# Patient Record
Sex: Female | Born: 1970 | Race: White | Hispanic: No | Marital: Married | Smoking: Current some day smoker
Health system: Southern US, Community
[De-identification: ages and names within clinical notes are randomized; demographics above are authoritative.]

## PROBLEM LIST (undated history)

## (undated) DIAGNOSIS — E559 Vitamin D deficiency, unspecified: Secondary | ICD-10-CM

## (undated) DIAGNOSIS — E785 Hyperlipidemia, unspecified: Secondary | ICD-10-CM

## (undated) DIAGNOSIS — R232 Flushing: Secondary | ICD-10-CM

## (undated) HISTORY — DX: Hyperlipidemia, unspecified: E78.5

## (undated) HISTORY — DX: Flushing: R23.2

## (undated) HISTORY — DX: Vitamin D deficiency, unspecified: E55.9

---

## 1990-08-25 HISTORY — PX: APPENDECTOMY: SHX54

## 2008-08-25 HISTORY — PX: ABDOMINAL HYSTERECTOMY: SHX81

## 2015-08-26 HISTORY — PX: CHOLECYSTECTOMY: SHX55

## 2016-11-20 ENCOUNTER — Encounter: Payer: Self-pay | Admitting: Primary Care

## 2016-11-20 ENCOUNTER — Ambulatory Visit (INDEPENDENT_AMBULATORY_CARE_PROVIDER_SITE_OTHER): Payer: Self-pay | Admitting: Primary Care

## 2016-11-20 VITALS — BP 122/76 | HR 62 | Temp 97.8°F | Ht 63.75 in | Wt 161.4 lb

## 2016-11-20 DIAGNOSIS — Z79890 Hormone replacement therapy (postmenopausal): Secondary | ICD-10-CM | POA: Insufficient documentation

## 2016-11-20 DIAGNOSIS — E8941 Symptomatic postprocedural ovarian failure: Secondary | ICD-10-CM

## 2016-11-20 MED ORDER — ESTRADIOL 1 MG PO TABS: 1.0000 mg | ORAL_TABLET | Freq: Every day | ORAL | 1 refills | Status: DC

## 2016-11-20 NOTE — Progress Notes (Signed)
Pre visit review using our clinic review tool, if applicable. No additional management support is needed unless otherwise documented below in the visit note. 

## 2016-11-20 NOTE — Patient Instructions (Signed)
I sent estradiol 1 mg tablets to your pharmacy. Take 1 tablet by mouth daily. Try to allow for a 7 day rest period after three weeks.  Please call me if you have trouble on the estradiol 1 mg dose.   Please be aware of the potential health risks associated with this medication including breast cancer, dementia, heart disease, blood clots.  It was a pleasure to meet you today! Please don't hesitate to call me with any questions. Welcome to [facility]!

## 2016-11-20 NOTE — Progress Notes (Signed)
   Subjective:    Patient ID: Kelly Banks, female    DOB: 1970/11/08, 46 y.o.   MRN: [id number]  HPI  [name] is a 46 year old female who presents today to establish care and discuss the problems mentioned below. Will obtain old records. She had a complete physical in January 2018 when entering into the [place]. She is from [place].  1) Hormone Replacement Therapy: Currently managed on estradiol 2 mg for which she's taken for the past 4-5 years in [place]. She experiences insomnia, night sweats, hot flashes during the day, headaches without her medication. Hysterectomy in 2010. She's tried several over the counter medications for hot flashes without improvement. She can go a maximum of three days off of her medication before she experiences return of symptoms. She is aware of the potential health risks associated with HRT use.  Review of Systems  Constitutional: Negative for fatigue.  Respiratory: Negative for shortness of breath.   Cardiovascular: Negative for chest pain and leg swelling.  Genitourinary: Negative for vaginal bleeding.       Hot flashes without medication  Skin: Negative for color change.  Neurological: Negative for headaches.       No past medical history on file.   Social History   Social History  . Marital status: Married    Spouse name: N/A  . Number of children: N/A  . Years of education: N/A   Occupational History  . Not on file.   Social History Main Topics  . Smoking status: Current Every Day Smoker  . Smokeless tobacco: Never Used  . Alcohol use No  . Drug use: Unknown  . Sexual activity: Not on file   Other Topics Concern  . Not on file   Social History Narrative   Married.   Moved from [place].   Enjoys painting.    Past Surgical History:  Procedure Laterality Date  . ABDOMINAL HYSTERECTOMY  2010  . APPENDECTOMY  1992  . CHOLECYSTECTOMY  2017    Family History  Problem Relation Age of Onset  . Hypertension Mother   .  Arthritis Mother     Allergies  Allergen Reactions  . Novocain [Procaine]     No current outpatient prescriptions on file prior to visit.   No current facility-administered medications on file prior to visit.     BP 122/76   Pulse 62   Temp 97.8 F (36.6 C) (Oral)   Ht 5' 3.75" (1.619 m)   Wt 161 lb 6.4 oz (73.2 kg)   SpO2 97%   BMI 27.92 kg/m    Objective:   Physical Exam  Constitutional: She appears well-nourished.  Neck: Neck supple.  Cardiovascular: Normal rate and regular rhythm.   Pulmonary/Chest: Effort normal and breath sounds normal.  Skin: Skin is warm and dry.  Psychiatric: She has a normal mood and affect.          Assessment & Plan:

## 2016-11-20 NOTE — Assessment & Plan Note (Signed)
Hysterectomy in 2010. Managed on estradiol 2 mg x 4-5 years. Discussed potential health risks associated with HRT in great detail today. Discussed alternative treatment for hot flashes for which she declined. Will try her at a lower dose of 1 mg to see if this is effective enough. She will update if not.

## 2016-12-08 ENCOUNTER — Other Ambulatory Visit: Payer: Self-pay | Admitting: Primary Care

## 2016-12-08 DIAGNOSIS — N951 Menopausal and female climacteric states: Secondary | ICD-10-CM

## 2016-12-08 MED ORDER — ESTRADIOL 2 MG PO TABS: 2.0000 mg | ORAL_TABLET | Freq: Every day | ORAL | 1 refills | Status: DC

## 2016-12-08 NOTE — Progress Notes (Signed)
Patient's husband sent My Chart message stating that the lower dose of Estradiol was causing return of symptoms. Patient requesting to return to her 2 mg dose. Rx sent to pharmacy and she does understand potential consequences/long term effects of this medication.

## 2017-04-17 ENCOUNTER — Telehealth: Payer: Self-pay

## 2017-04-17 NOTE — Telephone Encounter (Signed)
Pt seeing spots in her rt eye; when first happened like fireworks display but now just small black spot that remains. Pt has had this for 3 days. No pain and no vision problems. Pt will not have ins until Jun 25, 2017. Wants to know if any suggestions without being seen. Will go to UC or ED if worsens over weekend.

## 2017-04-17 NOTE — Telephone Encounter (Signed)
PLEASE NOTE: All timestamps contained within this report are represented as [place] Standard Time. CONFIDENTIALTY NOTICE: This fax transmission is intended only for the addressee. It contains information that is legally privileged, confidential or otherwise protected from use or disclosure. If you are not the intended recipient, you are strictly prohibited from reviewing, disclosing, copying using or disseminating any of this information or taking any action in reliance on or regarding this information. If you have received this fax in error, please notify us immediately by telephone so that we can arrange for its return to [place]. Phone: [phone], Toll-Free: [phone], Fax: [phone] Page: 1 of 1 Call Id: [id number] [facility] - Client Nonclinical Telephone Record [facility] - Client Client Site [facility] - Day Physician Alma Friendly - NP Contact Type Call Who Is Calling Patient / Member / Family / Caregiver Caller Name [place] Phone Number [phone] Reason for Call Symptomatic / Request for Health Information Initial Comment Caller's wife might have scratched her eye, says she sees a black spot in her eye that move left & right. He says her wife is an Development worker, international aid By: Sharlet Salina Transaction Date/Time: 04/17/2017 4:20:12 PM (ET)

## 2017-04-19 NOTE — Telephone Encounter (Signed)
Would recommend she seen an eye doctor as soon as possible if symptoms don't improve. If she feels something in her eye then she can try flushing her eye with normal saline solution.

## 2017-04-20 NOTE — Telephone Encounter (Signed)
Husband took her to [facility] and was evaluated there.

## 2017-06-08 ENCOUNTER — Other Ambulatory Visit: Payer: Self-pay | Admitting: Primary Care

## 2017-06-08 ENCOUNTER — Encounter: Payer: Self-pay | Admitting: Family Medicine

## 2017-06-08 ENCOUNTER — Ambulatory Visit (INDEPENDENT_AMBULATORY_CARE_PROVIDER_SITE_OTHER): Payer: 59 | Admitting: Family Medicine

## 2017-06-08 VITALS — BP 100/60 | HR 49 | Temp 97.7°F | Ht 63.75 in | Wt 163.8 lb

## 2017-06-08 DIAGNOSIS — N951 Menopausal and female climacteric states: Secondary | ICD-10-CM

## 2017-06-08 DIAGNOSIS — M6798 Unspecified disorder of synovium and tendon, other site: Secondary | ICD-10-CM

## 2017-06-08 DIAGNOSIS — G8929 Other chronic pain: Secondary | ICD-10-CM | POA: Diagnosis not present

## 2017-06-08 DIAGNOSIS — M25551 Pain in right hip: Secondary | ICD-10-CM

## 2017-06-08 DIAGNOSIS — M7061 Trochanteric bursitis, right hip: Secondary | ICD-10-CM | POA: Diagnosis not present

## 2017-06-08 DIAGNOSIS — M7062 Trochanteric bursitis, left hip: Secondary | ICD-10-CM | POA: Diagnosis not present

## 2017-06-08 DIAGNOSIS — M67952 Unspecified disorder of synovium and tendon, left thigh: Secondary | ICD-10-CM | POA: Diagnosis not present

## 2017-06-08 DIAGNOSIS — M67951 Unspecified disorder of synovium and tendon, right thigh: Secondary | ICD-10-CM

## 2017-06-08 DIAGNOSIS — M25552 Pain in left hip: Secondary | ICD-10-CM

## 2017-06-08 MED ORDER — ESTRADIOL 2 MG PO TABS
2.0000 mg | ORAL_TABLET | Freq: Every day | ORAL | 1 refills | Status: DC
Start: 2017-06-08 — End: 2017-11-26

## 2017-06-08 MED ORDER — METHYLPREDNISOLONE ACETATE 40 MG/ML IJ SUSP
80.0000 mg | Freq: Once | INTRAMUSCULAR | Status: AC
Start: 2017-06-08 — End: 2017-06-08
  Administered 2017-06-08: 80 mg via INTRA_ARTICULAR

## 2017-06-08 MED ORDER — METHYLPREDNISOLONE ACETATE 40 MG/ML IJ SUSP
80.0000 mg | Freq: Once | INTRAMUSCULAR | Status: AC
  Administered 2017-06-08: 80 mg via INTRA_ARTICULAR

## 2017-06-08 NOTE — Progress Notes (Signed)
Dr. Frederico Hamman T. Kyal Arts, MD, [facility] [place], [address] Phone: [phone] Fax: [phone]  06/08/2017  Patient: Kelly Banks, MRN: [id number], DOB: [birth year], 46 y.o.  Primary Physician:  Pleas Koch, NP   Chief Complaint  Patient presents with  . Hip Pain    Bilateral   Subjective:   Kelly Banks is a 46 y.o. 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 Laterally.   There was a bit of a language barrier given that the patient is [place] and primarily speaks [place] and we had no Optometrist. She has been very getting this, but there was at times a bit of a translational effect.  She has been having intermittent pain in the lateral posterior buttocks for some time. She was getting injections of some sort in [place].She shows me this and it is not of a medication that is FDA approved in [place]. There is a combination of multiple vitamins as well as some anti-inflammatory properties. I've never seen anything like this for sale in [place].  B GTB and b glute medius tendinopathy  Past Medical History, Surgical History, Social History, Family History, Problem List, Medications, and Allergies have been reviewed and updated if relevant.  Patient Active Problem List   Diagnosis Date Noted  . Hot flashes due to menopause 11/20/2016    No past medical history on file.  Past Surgical History:  Procedure Laterality Date  . ABDOMINAL HYSTERECTOMY  2010  . APPENDECTOMY  1992  . CHOLECYSTECTOMY  2017    Social History   Social History  . Marital status: Married    Spouse name: N/A  . Number of children: N/A  . Years of education: N/A   Occupational History  . Not on file.   Social History Main Topics  . Smoking status: Former Research scientist (life sciences)  . Smokeless tobacco: Never Used  . Alcohol use No  . Drug use: Unknown  .  Sexual activity: Not on file   Other Topics Concern  . Not on file   Social History Narrative   Married.   Moved from [place].   Enjoys painting.    Family History  Problem Relation Age of Onset  . Hypertension Mother   . Arthritis Mother     Allergies  Allergen Reactions  . Novocain [Procaine]     Medication list reviewed and updated in full in [place].  GEN: No fevers, chills. Nontoxic. Primarily MSK c/o today. MSK: Detailed in the HPI GI: tolerating PO intake without difficulty Neuro: No numbness, parasthesias, or tingling associated. Otherwise the pertinent positives of the ROS are noted above.   Objective:   BP 100/60   Pulse (!) 49   Temp 97.7 F (36.5 C) (Oral)   Ht 5' 3.75" (1.619 m)   Wt 163 lb 12 oz (74.3 kg)   BMI 28.33 kg/m    GEN: WDWN, NAD, Non-toxic, Alert & Oriented x 3 HEENT: Atraumatic, Normocephalic.  Ears and Nose: No external deformity. EXTR: No clubbing/cyanosis/edema NEURO: Normal gait.  PSYCH: Normally interactive. Conversant. Not depressed or anxious appearing.  Calm demeanor.   HIP EXAM: SIDE: b ROM: Abduction, Flexion, Internal and External range of motion: full Pain with terminal IROM and EROM: no GTB: B Pain at B glute medius distal insertion SLR: NEG Knees: No effusion FABER: NT REVERSE FABER: NT, neg Piriformis: NT  at direct palpation Str: flexion: 5/5 abduction: 5/5 adduction: 5/5 Strength testing non-tender   Radiology: No results found.  Assessment and Plan:   Trochanteric bursitis of both hips  Chronic hip pain, bilateral - Plan: methylPREDNISolone acetate (DEPO-MEDROL) injection 80 mg, methylPREDNISolone acetate (DEPO-MEDROL) injection 80 mg  Tendinopathy of left gluteus medius  Tendinopathy of right gluteus medius  Bilateral trochanteric bursitis with gluteus medius tendinopathy. Her strength is excellent. Continue with core rehabilitation. Hopefully this will be up and back her symptoms and get  her some relief.  Trochanteric Bursitis Injection, R Verbal consent obtained. Risks (including infection, potential atrophy), benefits, and alternatives reviewed. Greater trochanter sterilely prepped with Chloraprep. Ethyl Chloride used for anesthesia. 8 cc of Lidocaine 1% injected with 2 mL of Depo-Medrol 40 mg. 1/2 injeceted to trochanteric bursa at area of maximal tenderness at greater trochanter and 1/2 injected along distal gluteus medius insertion. Needle taken to bone to troch bursa, flows easily. Bursa massaged. No bleeding and no complications. Decreased pain after injection. Needle: 22 gauge spinal needle   Trochanteric Bursitis Injection, L Verbal consent obtained. Risks (including infection, potential atrophy), benefits, and alternatives reviewed. Greater trochanter sterilely prepped with Chloraprep. Ethyl Chloride used for anesthesia. 8 cc of Lidocaine 1% injected with 2 mL of Depo-Medrol 40 mg. 1/2 injected into trochanteric bursa at area of maximal tenderness at greater trochanter and 1/2 injected into gluteus medius insertion. Needle taken to bone to troch bursa, flows easily. Bursa massaged. No bleeding and no complications. Decreased pain after injection. Needle: 22 gauge spinal needle   Follow-up: prn only  Meds ordered this encounter  Medications  . Glucosamine HCl (GLUCOSAMINE PO)    Sig: Take 2 tablets by mouth daily.  . methylPREDNISolone acetate (DEPO-MEDROL) injection 80 mg  . methylPREDNISolone acetate (DEPO-MEDROL) injection 80 mg   Signed,  Zymir Napoli T. Shellia Hartl, MD   Allergies as of 06/08/2017      Reactions   Novocain [procaine]       Medication List       Accurate as of 06/08/17  1:42 PM. Always use your most recent med list.          estradiol 2 MG tablet Commonly known as:  ESTRACE Take 1 tablet (2 mg total) by mouth daily.   GLUCOSAMINE PO Take 2 tablets by mouth daily.

## 2017-06-10 ENCOUNTER — Other Ambulatory Visit: Payer: Self-pay | Admitting: Primary Care

## 2017-06-10 DIAGNOSIS — N951 Menopausal and female climacteric states: Secondary | ICD-10-CM

## 2017-11-09 ENCOUNTER — Other Ambulatory Visit: Payer: Self-pay | Admitting: Primary Care

## 2017-11-09 DIAGNOSIS — E8941 Symptomatic postprocedural ovarian failure: Secondary | ICD-10-CM

## 2017-11-26 ENCOUNTER — Ambulatory Visit (INDEPENDENT_AMBULATORY_CARE_PROVIDER_SITE_OTHER): Payer: 59 | Admitting: Primary Care

## 2017-11-26 VITALS — BP 96/60 | HR 56 | Temp 97.7°F | Ht 63.75 in | Wt 169.2 lb

## 2017-11-26 DIAGNOSIS — Z1239 Encounter for other screening for malignant neoplasm of breast: Secondary | ICD-10-CM

## 2017-11-26 DIAGNOSIS — M79604 Pain in right leg: Secondary | ICD-10-CM

## 2017-11-26 DIAGNOSIS — Z79890 Hormone replacement therapy (postmenopausal): Secondary | ICD-10-CM | POA: Diagnosis not present

## 2017-11-26 DIAGNOSIS — Z1231 Encounter for screening mammogram for malignant neoplasm of breast: Secondary | ICD-10-CM

## 2017-11-26 DIAGNOSIS — M79605 Pain in left leg: Secondary | ICD-10-CM

## 2017-11-26 DIAGNOSIS — N951 Menopausal and female climacteric states: Secondary | ICD-10-CM

## 2017-11-26 LAB — LIPID PANEL
CHOL/HDL RATIO: 5
CHOLESTEROL: 259 mg/dL — AB (ref 0–200)
HDL: 54.7 mg/dL (ref >39.00)
LDL Cholesterol: 168 mg/dL — ABNORMAL HIGH (ref 0–99)
NonHDL: 204.37
TRIGLYCERIDES: 182 mg/dL — AB (ref 0.0–149.0)
VLDL: 36.4 mg/dL (ref 0.0–40.0)

## 2017-11-26 LAB — COMPREHENSIVE METABOLIC PANEL
ALBUMIN: 4 g/dL (ref 3.5–5.2)
ALK PHOS: 50 U/L (ref 39–117)
ALT: 26 U/L (ref 0–35)
AST: 20 U/L (ref 0–37)
BUN: 17 mg/dL (ref 6–23)
CHLORIDE: 104 meq/L (ref 96–112)
CO2: 28 mEq/L (ref 19–32)
CREATININE: 0.91 mg/dL (ref 0.40–1.20)
Calcium: 10.5 mg/dL (ref 8.4–10.5)
GFR: 69.81 mL/min (ref >60.00)
GLUCOSE: 69 mg/dL — AB (ref 70–99)
POTASSIUM: 4.6 meq/L (ref 3.5–5.1)
SODIUM: 139 meq/L (ref 135–145)
TOTAL PROTEIN: 7.4 g/dL (ref 6.0–8.3)
Total Bilirubin: 0.4 mg/dL (ref 0.2–1.2)

## 2017-11-26 LAB — CBC
HCT: 40.4 % (ref 36.0–46.0)
HEMOGLOBIN: 13.7 g/dL (ref 12.0–15.0)
MCHC: 33.9 g/dL (ref 30.0–36.0)
MCV: 89.6 fl (ref 78.0–100.0)
Platelets: 299 10*3/uL (ref 150.0–400.0)
RBC: 4.51 Mil/uL (ref 3.87–5.11)
RDW: 13.7 % (ref 11.5–15.5)
WBC: 5.9 10*3/uL (ref 4.0–10.5)

## 2017-11-26 LAB — TSH: TSH: 1.24 u[IU]/mL (ref 0.35–4.50)

## 2017-11-26 LAB — VITAMIN B12: VITAMIN B 12: 570 pg/mL (ref 211–911)

## 2017-11-26 LAB — VITAMIN D 25 HYDROXY (VIT D DEFICIENCY, FRACTURES): VITD: 15.85 ng/mL — AB (ref 30.00–100.00)

## 2017-11-26 MED ORDER — ESTRADIOL 2 MG PO TABS
2.0000 mg | ORAL_TABLET | Freq: Every day | ORAL | 1 refills | Status: DC
Start: 2017-11-26 — End: 2018-06-09

## 2017-11-26 NOTE — Assessment & Plan Note (Signed)
Managed on estradiol 2 mg for which she's taken for years. Check lipids, TSH, CMP today. Order for screening mammogram placed today. Refills sent to pharmacy.

## 2017-11-26 NOTE — Progress Notes (Signed)
Subjective:    Patient ID: Kelly Banks, female    DOB: 1971-03-21, 47 y.o.   MRN: [id number]  HPI  Ms. Kelly Banks is a 47 year old female who presents today for medication refill and a chief complaint of lower extremity pain.   She is currently managed on estradiol 2 mg tablets for which she's taken for years. We attempted to wean down on her dose last year for which she could not tolerate. She is needing refills today.   She also reports bilateral lower extremity cramping/achy feelings that have been present intermittently for the past 2 years. Over the past 2 weeks her legs have been more consistently bothering her. She's been taking OTC magnesium and potassium and applied Voltaren Gel with some improvement. Her symptoms are not present during ambulation or during the day, only at night when resting. She denies calf swelling, erythema, extreme exercise, long travel.   Review of Systems  Respiratory: Negative for shortness of breath.   Cardiovascular: Negative for chest pain.  Musculoskeletal: Positive for myalgias.       Bilateral lower extremity pain/cramping  Neurological: Negative for dizziness and headaches.       No past medical history on file.   Social History   Socioeconomic History  . Marital status: Married    Spouse name: Not on file  . Number of children: Not on file  . Years of education: Not on file  . Highest education level: Not on file  Occupational History  . Not on file  Social Needs  . Financial resource strain: Not on file  . Food insecurity:    Worry: Not on file    Inability: Not on file  . Transportation needs:    Medical: Not on file    Non-medical: Not on file  Tobacco Use  . Smoking status: Former Research scientist (life sciences)  . Smokeless tobacco: Never Used  Substance and Sexual Activity  . Alcohol use: No  . Drug use: Not on file  . Sexual activity: Not on file  Lifestyle  . Physical activity:    Days per week: Not on file    Minutes per session: Not on  file  . Stress: Not on file  Relationships  . Social connections:    Talks on phone: Not on file    Gets together: Not on file    Attends religious service: Not on file    Active member of club or organization: Not on file    Attends meetings of clubs or organizations: Not on file    Relationship status: Not on file  . Intimate partner violence:    Fear of current or ex partner: Not on file    Emotionally abused: Not on file    Physically abused: Not on file    Forced sexual activity: Not on file  Other Topics Concern  . Not on file  Social History Narrative   Married.   Moved from [place].   Enjoys painting.     Family History  Problem Relation Age of Onset  . Hypertension Mother   . Arthritis Mother     Allergies  Allergen Reactions  . Procaine Palpitations    Current Outpatient Medications on File Prior to Visit  Medication Sig Dispense Refill  . Glucosamine HCl (GLUCOSAMINE PO) Take 2 tablets by mouth daily.     No current facility-administered medications on file prior to visit.     BP 96/60   Pulse (!) 56   Temp 97.7 F (  36.5 C) (Oral)   Ht 5' 3.75" (1.619 m)   Wt 169 lb 4 oz (76.8 kg)   SpO2 98%   BMI 29.28 kg/m    Objective:   Physical Exam  Constitutional: She appears well-nourished.  Neck: Neck supple.  Cardiovascular: Normal rate and regular rhythm.  Pulses:      Dorsalis pedis pulses are 2+ on the right side, and 2+ on the left side.       Posterior tibial pulses are 2+ on the right side, and 2+ on the left side.  No calf swelling, no lower extremity edema  Pulmonary/Chest: Effort normal and breath sounds normal.  Skin: Skin is warm and dry. No erythema.  Psychiatric: She has a normal mood and affect.          Assessment & Plan:

## 2017-11-26 NOTE — Patient Instructions (Signed)
Stop by the lab prior to leaving today. I will notify you of your results once received.   Call the [facility] to schedule your mammogram.   Start stretching your legs before bedtime and every morning.  I sent refills for your estradiol to your pharmacy.  Have a great time in [place]! It was a pleasure to see you today!

## 2017-11-26 NOTE — Assessment & Plan Note (Signed)
Intermittent for years, more recurrent over last 2 weeks. No suspicion for DVT on exam or during HPI. No signs of claudication, pedal pulses 2+ bilaterally. Will have her start stretching before bed and every morning. Check labs today including vitamin D, B12, CBC.

## 2017-12-01 ENCOUNTER — Encounter: Payer: Self-pay | Admitting: *Deleted

## 2018-06-09 ENCOUNTER — Other Ambulatory Visit: Payer: Self-pay | Admitting: Primary Care

## 2018-06-09 DIAGNOSIS — N951 Menopausal and female climacteric states: Secondary | ICD-10-CM

## 2018-11-01 ENCOUNTER — Other Ambulatory Visit: Payer: Self-pay | Admitting: Primary Care

## 2018-11-01 DIAGNOSIS — N951 Menopausal and female climacteric states: Secondary | ICD-10-CM

## 2018-11-09 ENCOUNTER — Other Ambulatory Visit: Payer: Self-pay | Admitting: Primary Care

## 2018-11-09 DIAGNOSIS — N951 Menopausal and female climacteric states: Secondary | ICD-10-CM

## 2018-11-11 ENCOUNTER — Other Ambulatory Visit: Payer: Self-pay

## 2018-11-11 ENCOUNTER — Encounter: Payer: Self-pay | Admitting: Primary Care

## 2018-11-11 ENCOUNTER — Ambulatory Visit (INDEPENDENT_AMBULATORY_CARE_PROVIDER_SITE_OTHER): Payer: 59 | Admitting: Primary Care

## 2018-11-11 VITALS — BP 100/62 | HR 60 | Temp 97.8°F | Ht 63.75 in | Wt 161.5 lb

## 2018-11-11 DIAGNOSIS — E785 Hyperlipidemia, unspecified: Secondary | ICD-10-CM | POA: Insufficient documentation

## 2018-11-11 DIAGNOSIS — Z1239 Encounter for other screening for malignant neoplasm of breast: Secondary | ICD-10-CM | POA: Diagnosis not present

## 2018-11-11 DIAGNOSIS — E559 Vitamin D deficiency, unspecified: Secondary | ICD-10-CM | POA: Insufficient documentation

## 2018-11-11 DIAGNOSIS — N951 Menopausal and female climacteric states: Secondary | ICD-10-CM | POA: Diagnosis not present

## 2018-11-11 DIAGNOSIS — Z79890 Hormone replacement therapy (postmenopausal): Secondary | ICD-10-CM

## 2018-11-11 DIAGNOSIS — R21 Rash and other nonspecific skin eruption: Secondary | ICD-10-CM | POA: Diagnosis not present

## 2018-11-11 MED ORDER — METHYLPREDNISOLONE ACETATE 80 MG/ML IJ SUSP
80.0000 mg | Freq: Once | INTRAMUSCULAR | Status: AC
  Administered 2018-11-11: 80 mg via INTRAMUSCULAR

## 2018-11-11 MED ORDER — ESTRADIOL 2 MG PO TABS
2.0000 mg | ORAL_TABLET | Freq: Every day | ORAL | 0 refills | Status: DC
Start: 2018-11-11 — End: 2019-02-02

## 2018-11-11 NOTE — Progress Notes (Signed)
Subjective:    Patient ID: Kelly Banks, female    DOB: 1971/08/18, 48 y.o.   MRN: [id number]  HPI  Kelly Banks is a 48 year old female who presents today with a chief complaint of rash and medication refill. She is also due for follow up of hyperlipidemia.   1) Rash: Her rash is located to the bilateral cheeks with redness, swelling, and itchy sensation. Her symptoms began four days ago. She denies changes in soaps, detergents, medications. She has been in her garden working with flowers and soil, was wearing gardening gloves. She's taken Claritin, hydrocortisone, tea tree oil without improvement. She denies shortness of breath, wheezing, throat tightness.   2) Hyperlipidemia: Lipid panel last year with LDL of 168 with TC of 259 in April 2019. She is doing some exercise, endorses a healthy diet. She has eaten within the last one hour.   3) Hot Flashes: She is requesting a refill of her estradiol for which she takes once daily. No recent mammogram on file as she never followed through last year. She is agreeable to a mammogram now. We attempted to wean down on her estradiol dose before and she was unable to tolerate.   Review of Systems  Constitutional: Negative for fever.  Respiratory: Negative for shortness of breath.   Cardiovascular: Negative for chest pain.  Skin: Positive for rash.       No past medical history on file.   Social History   Socioeconomic History  . Marital status: Married    Spouse name: Not on file  . Number of children: Not on file  . Years of education: Not on file  . Highest education level: Not on file  Occupational History  . Not on file  Social Needs  . Financial resource strain: Not on file  . Food insecurity:    Worry: Not on file    Inability: Not on file  . Transportation needs:    Medical: Not on file    Non-medical: Not on file  Tobacco Use  . Smoking status: Former Research scientist (life sciences)  . Smokeless tobacco: Never Used  Substance and Sexual Activity   . Alcohol use: No  . Drug use: Not on file  . Sexual activity: Not on file  Lifestyle  . Physical activity:    Days per week: Not on file    Minutes per session: Not on file  . Stress: Not on file  Relationships  . Social connections:    Talks on phone: Not on file    Gets together: Not on file    Attends religious service: Not on file    Active member of club or organization: Not on file    Attends meetings of clubs or organizations: Not on file    Relationship status: Not on file  . Intimate partner violence:    Fear of current or ex partner: Not on file    Emotionally abused: Not on file    Physically abused: Not on file    Forced sexual activity: Not on file  Other Topics Concern  . Not on file  Social History Narrative   Married.   Moved from [place].   Enjoys painting.     Family History  Problem Relation Age of Onset  . Hypertension Mother   . Arthritis Mother     Allergies  Allergen Reactions  . Procaine Palpitations    Current Outpatient Medications on File Prior to Visit  Medication Sig Dispense Refill  . estradiol (  ESTRACE) 2 MG tablet TAKE 1 TABLET BY MOUTH ONCE DAILY 90 tablet 0  . Glucosamine HCl (GLUCOSAMINE PO) Take 2 tablets by mouth daily.     No current facility-administered medications on file prior to visit.     BP 100/62   Pulse 60   Temp 97.8 F (36.6 C) (Oral)   Ht 5' 3.75" (1.619 m)   Wt 161 lb 8 oz (73.3 kg)   SpO2 97%   BMI 27.94 kg/m    Objective:   Physical Exam  Constitutional: She appears well-nourished.  Neck: Neck supple.  Cardiovascular: Normal rate and regular rhythm.  Respiratory: Effort normal and breath sounds normal.  Skin: Skin is warm and dry.  Erythema without cellulitis to bilateral cheeks, mild swelling to left cheek. No bumps, wounds.  Psychiatric: She has a normal mood and affect.           Assessment & Plan:

## 2018-11-11 NOTE — Assessment & Plan Note (Signed)
Repeat vitamin D pending. Discussed to start 2000 units daily.

## 2018-11-11 NOTE — Addendum Note (Signed)
Addended by: Jacqualin Combes on: 11/11/2018 01:12 PM   Modules accepted: Orders

## 2018-11-11 NOTE — Assessment & Plan Note (Signed)
Never completed mammogram last year, strongly advise she complete this year. She agrees so orders were placed. We also discussed risk factors for long term estrogen replacement including CAD, breast cancer, etc and she verbalized understanding. She has hyperlipidemia and has declined treatment in the past.   Repeat lipids pending. Mammogram ordered. Refills provided.

## 2018-11-11 NOTE — Assessment & Plan Note (Signed)
Appears to be contact dermatitis. Also could be some underlying sunburn. No evidence of shingles. IM Depo Medrol 80 mg provided today as she refuses oral prednisone. She will update if symptoms do not improve. Discussed to use sunscreen when outdoors.

## 2018-11-11 NOTE — Patient Instructions (Addendum)
You must complete a mammogram in order to continue your estrogen medication. Please call the [facility] to schedule your mammogram.   Start taking vitamin D 2000 units daily.   Start exercising. You should be getting 150 minutes of moderate intensity exercise weekly.  It's important to improve your diet by reducing consumption of fast food, fried food, processed snack foods, sugary drinks. Increase consumption of fresh vegetables and fruits, whole grains, water.  Ensure you are drinking 64 ounces of water daily.  Schedule a lab only appointment to return when you've not eaten for four hours.   Please update me if no resolve in your rash in 3-4 days. Continue loratadine. Wear sunscreen when outdoors.

## 2018-11-11 NOTE — Assessment & Plan Note (Signed)
Noted on labs from last year.  Discussed

## 2018-12-22 ENCOUNTER — Telehealth: Payer: Self-pay | Admitting: *Deleted

## 2018-12-22 NOTE — Telephone Encounter (Signed)
Spoken and notified patient's husband of Tawni Millers comments. Patient's husband verbalized understanding.

## 2018-12-22 NOTE — Telephone Encounter (Signed)
Patient's husband called stating that patient was told that her estrogen can not be refilled until she has a mammogram. Kelly Banks stated that she was scheduled for a mammogram on 12/24/18 and they got a call that this test is being rescheduled for 03/01/19. Patient's husband stated that his wife can not do without this medication because of her hot flashes. Patient's husband wants to know if medication can be refilled until her upcoming appointment on 03/01/19?

## 2018-12-22 NOTE — Telephone Encounter (Signed)
Yes, that will be fine. She was provided with a three month supply in March 2020, so this will last her until June 2020. We will refill at that time.

## 2019-01-18 ENCOUNTER — Other Ambulatory Visit: Payer: Self-pay

## 2019-01-18 ENCOUNTER — Telehealth: Payer: Self-pay

## 2019-01-18 ENCOUNTER — Encounter: Payer: Self-pay | Admitting: Primary Care

## 2019-01-18 ENCOUNTER — Ambulatory Visit (INDEPENDENT_AMBULATORY_CARE_PROVIDER_SITE_OTHER): Payer: 59 | Admitting: Primary Care

## 2019-01-18 VITALS — BP 104/64 | HR 65 | Temp 97.8°F | Ht 63.75 in | Wt 160.8 lb

## 2019-01-18 DIAGNOSIS — K121 Other forms of stomatitis: Secondary | ICD-10-CM

## 2019-01-18 DIAGNOSIS — E785 Hyperlipidemia, unspecified: Secondary | ICD-10-CM | POA: Diagnosis not present

## 2019-01-18 DIAGNOSIS — E559 Vitamin D deficiency, unspecified: Secondary | ICD-10-CM

## 2019-01-18 HISTORY — DX: Other forms of stomatitis: K12.1

## 2019-01-18 LAB — COMPREHENSIVE METABOLIC PANEL
ALT: 19 U/L (ref 0–35)
AST: 20 U/L (ref 0–37)
Albumin: 4.2 g/dL (ref 3.5–5.2)
Alkaline Phosphatase: 57 U/L (ref 39–117)
BUN: 12 mg/dL (ref 6–23)
CO2: 29 mEq/L (ref 19–32)
Calcium: 10.1 mg/dL (ref 8.4–10.5)
Chloride: 102 mEq/L (ref 96–112)
Creatinine, Ser: 0.98 mg/dL (ref 0.40–1.20)
GFR: 60.02 mL/min (ref >60.00)
Glucose, Bld: 92 mg/dL (ref 70–99)
Potassium: 5 mEq/L (ref 3.5–5.1)
Sodium: 137 mEq/L (ref 135–145)
Total Bilirubin: 0.4 mg/dL (ref 0.2–1.2)
Total Protein: 7.7 g/dL (ref 6.0–8.3)

## 2019-01-18 LAB — LIPID PANEL
Cholesterol: 285 mg/dL — ABNORMAL HIGH (ref 0–200)
HDL: 55.2 mg/dL (ref >39.00)
LDL Cholesterol: 209 mg/dL — ABNORMAL HIGH (ref 0–99)
NonHDL: 229.88
Total CHOL/HDL Ratio: 5
Triglycerides: 104 mg/dL (ref 0.0–149.0)
VLDL: 20.8 mg/dL (ref 0.0–40.0)

## 2019-01-18 LAB — VITAMIN D 25 HYDROXY (VIT D DEFICIENCY, FRACTURES): VITD: 33.94 ng/mL (ref 30.00–100.00)

## 2019-01-18 MED ORDER — TRIAMCINOLONE ACETONIDE 0.1 % MT PSTE: 1.0000 "application " | PASTE | Freq: Two times a day (BID) | OROMUCOSAL | 0 refills | Status: DC

## 2019-01-18 MED ORDER — LIDOCAINE VISCOUS HCL 2 % MT SOLN: 15.0000 mL | Freq: Three times a day (TID) | OROMUCOSAL | 0 refills | Status: DC | PRN

## 2019-01-18 NOTE — Progress Notes (Signed)
Subjective:    Patient ID: Kelly Banks, female    DOB: 04-16-1971, 48 y.o.   MRN: [id number]  HPI  Kelly Banks is a 48 year old female who presents today with a chief complaint of mouth sores.  History of intermittent recurrent boils to groin since childhood. She noticed a toothache under her left upper molar crown about one week ago. She obtained some antibiotics from a local [place] store and has been taking Amoxicillin for the last 6 days with improvement in pain. Two days ago she then she's noticed painful sores to the roof of her mouth. She denies changes in food, drinking allergy to Amoxicillin, shortness of breath, throat tightness, fevers.  Review of Systems  Constitutional: Negative for fever.  HENT: Negative for sore throat.        Oral sores  Respiratory: Negative for cough and shortness of breath.        Past Medical History:  Diagnosis Date  . Hot flashes   . Hyperlipidemia   . Vitamin D deficiency      Social History   Socioeconomic History  . Marital status: Married    Spouse name: Not on file  . Number of children: Not on file  . Years of education: Not on file  . Highest education level: Not on file  Occupational History  . Not on file  Social Needs  . Financial resource strain: Not on file  . Food insecurity:    Worry: Not on file    Inability: Not on file  . Transportation needs:    Medical: Not on file    Non-medical: Not on file  Tobacco Use  . Smoking status: Former Research scientist (life sciences)  . Smokeless tobacco: Never Used  Substance and Sexual Activity  . Alcohol use: No  . Drug use: Not on file  . Sexual activity: Not on file  Lifestyle  . Physical activity:    Days per week: Not on file    Minutes per session: Not on file  . Stress: Not on file  Relationships  . Social connections:    Talks on phone: Not on file    Gets together: Not on file    Attends religious service: Not on file    Active member of club or organization: Not on file   Attends meetings of clubs or organizations: Not on file    Relationship status: Not on file  . Intimate partner violence:    Fear of current or ex partner: Not on file    Emotionally abused: Not on file    Physically abused: Not on file    Forced sexual activity: Not on file  Other Topics Concern  . Not on file  Social History Narrative   Married.   Moved from [place].   Enjoys painting.    Past Surgical History:  Procedure Laterality Date  . ABDOMINAL HYSTERECTOMY  2010  . APPENDECTOMY  1992  . CHOLECYSTECTOMY  2017    Family History  Problem Relation Age of Onset  . Hypertension Mother   . Arthritis Mother     Allergies  Allergen Reactions  . Procaine Palpitations    Current Outpatient Medications on File Prior to Visit  Medication Sig Dispense Refill  . estradiol (ESTRACE) 2 MG tablet Take 1 tablet (2 mg total) by mouth daily. 90 tablet 0  . Glucosamine HCl (GLUCOSAMINE PO) Take 2 tablets by mouth daily.     No current facility-administered medications on file prior to visit.  BP 104/64   Pulse 65   Temp 97.8 F (36.6 C) (Tympanic)   Ht 5' 3.75" (1.619 m)   Wt 160 lb 12 oz (72.9 kg)   SpO2 98%   BMI 27.81 kg/m    Objective:   Physical Exam  Constitutional: She appears well-nourished. She does not have a sickly appearance. She does not appear ill.  HENT:  Several oral ulcers to hard palate of upper oral cavity. No dental irritation or drainage noted.  Neck: Neck supple.  Cardiovascular: Normal rate.  Respiratory: Effort normal.  Skin: Skin is warm and dry.           Assessment & Plan:

## 2019-01-18 NOTE — Telephone Encounter (Signed)
Kelly Banks from [facility] left v/m; triamcinolone dental paste in a small tube is $45.70 cost to pt. Kelly Banks request alternate med such as Dukes Magic mouthwash or whatever Gentry Fitz NP wants to prescribe that will not be as expensive. Kelly Banks request cb.

## 2019-01-18 NOTE — Assessment & Plan Note (Signed)
Noted to oral cavity.  Unclear cause. Doesn't appear to be allergic reaction to medications she's currently taking.   Rx for triamcinolone paste provided to use BID x 1 week. Rx for viscous lidocaine provided to use PRN pain.  She will update if symptoms persist.

## 2019-01-18 NOTE — Telephone Encounter (Signed)
Have the filled the viscous lidocaine? If not then cancel. Can we give a verbal order for Dukes Magic Mouthwash with: 80 ml liquid benadryl 80 ml viscous lidocaine 2% 80 ml maalox  #240. Swish and spit every 8 hours as needed.

## 2019-01-18 NOTE — Telephone Encounter (Signed)
Gave the order for the mouthwash as instructed.

## 2019-01-18 NOTE — Patient Instructions (Signed)
Apply the triamcinolone paste twice daily for one week.  You may use the viscous lidocaine three times daily for mouth pain. Swish and spit.   It was a pleasure to see you today!

## 2019-01-24 ENCOUNTER — Encounter: Payer: Self-pay | Admitting: *Deleted

## 2019-01-31 ENCOUNTER — Telehealth: Payer: Self-pay | Admitting: Primary Care

## 2019-01-31 DIAGNOSIS — E785 Hyperlipidemia, unspecified: Secondary | ICD-10-CM

## 2019-01-31 DIAGNOSIS — R232 Flushing: Secondary | ICD-10-CM

## 2019-01-31 DIAGNOSIS — Z79890 Hormone replacement therapy (postmenopausal): Secondary | ICD-10-CM

## 2019-01-31 MED ORDER — ATORVASTATIN CALCIUM 40 MG PO TABS: 40.0000 mg | ORAL_TABLET | Freq: Every day | ORAL | 0 refills | Status: DC

## 2019-01-31 NOTE — Telephone Encounter (Addendum)
Message left for patient to return my call.  

## 2019-01-31 NOTE — Telephone Encounter (Signed)
Spoken and notified patient of Kelly Banks comments. Patient verbalized understanding. Lab appt 03/14/2019

## 2019-01-31 NOTE — Telephone Encounter (Signed)
Spoken to patient's husband and patient. We corrected the phone number in patient's chart.  Also patient agreeable to the cholesterol medication. Patient's husband mention Lipitor since that is what he taking. Please send to [place].   Also would like a referral to GYN, discuss about estradiol, patient's husband thinks it would be a good idea to talk to specialist regarding continue this.

## 2019-01-31 NOTE — Telephone Encounter (Signed)
Patient's Husband and Patient called in today in regards to the letter they received in the mail. They did update patient's phone number which was incorrect.    They would like a call back to discuss this letter.   PHONE- [phone]

## 2019-01-31 NOTE — Telephone Encounter (Signed)
Noted. Rx for Lipitor sent to pharmacy. Needs repeat labs in 6 weeks, please schedule. Agree that GYN may need to get involved, referral placed.

## 2019-02-01 ENCOUNTER — Other Ambulatory Visit: Payer: Self-pay | Admitting: Primary Care

## 2019-02-01 DIAGNOSIS — N951 Menopausal and female climacteric states: Secondary | ICD-10-CM

## 2019-02-02 NOTE — Telephone Encounter (Signed)
Noted. Will refill until she can be seen by GYN.

## 2019-02-02 NOTE — Telephone Encounter (Signed)
Last prescribed on 11/11/2018. Last appointment on 01/18/2019. No future appointment

## 2019-02-24 ENCOUNTER — Telehealth: Payer: Self-pay

## 2019-02-24 NOTE — Telephone Encounter (Signed)
Coronavirus (COVID-19) Are you at risk?  Are you at risk for the Coronavirus (COVID-19)?  To be considered HIGH RISK for Coronavirus (COVID-19), you have to meet the following criteria:  . Traveled to [place], [place], [place], [place] or [place]; or in the [place] to [place], [place], [place], or [place]; and have fever, cough, and shortness of breath within the last 2 weeks of travel OR . Been in close contact with a person diagnosed with COVID-19 within the last 2 weeks and have fever, cough, and shortness of breath . IF YOU DO NOT MEET THESE CRITERIA, YOU ARE CONSIDERED LOW RISK FOR COVID-19.  What to do if you are HIGH RISK for COVID-19?  . If you are having a medical emergency, call 911. . Seek medical care right away. Before you go to a doctor's office, urgent care or emergency department, call ahead and tell them about your recent travel, contact with someone diagnosed with COVID-19, and your symptoms. You should receive instructions from your physician's office regarding next steps of care.  . When you arrive at healthcare provider, tell the healthcare staff immediately you have returned from visiting [place], [place], [place], [place] or [place]; or traveled in the [place] to [place], [place], [place], or [place]; in the last two weeks or you have been in close contact with a person diagnosed with COVID-19 in the last 2 weeks.   . Tell the health care staff about your symptoms: fever, cough and shortness of breath. . After you have been seen by a medical provider, you will be either: o Tested for (COVID-19) and discharged home on quarantine except to seek medical care if symptoms worsen, and asked to  - Stay home and avoid contact with others until you get your results (4-5 days)  - Avoid travel on public transportation if possible (such as bus, train, or airplane) or o Sent to the Emergency Department by EMS for evaluation, COVID-19 testing, and possible  admission depending on your condition and test results.  What to do if you are LOW RISK for COVID-19?  Reduce your risk of any infection by using the same precautions used for avoiding the common cold or flu:  . Wash your hands often with soap and warm water for at least 20 seconds.  If soap and water are not readily available, use an alcohol-based hand sanitizer with at least 60% alcohol.  . If coughing or sneezing, cover your mouth and nose by coughing or sneezing into the elbow areas of your shirt or coat, into a tissue or into your sleeve (not your hands). . Avoid shaking hands with others and consider head nods or verbal greetings only. . Avoid touching your eyes, nose, or mouth with unwashed hands.  . Avoid close contact with people who are sick. . Avoid places or events with large numbers of people in one location, like concerts or sporting events. . Carefully consider travel plans you have or are making. . If you are planning any travel outside or inside the [place], visit the CDC's Travelers' Health webpage for the latest health notices. . If you have some symptoms but not all symptoms, continue to monitor at home and seek medical attention if your symptoms worsen. . If you are having a medical emergency, call 911.   ADDITIONAL HEALTHCARE OPTIONS FOR PATIENTS  [facility] / e-Visit: [email or web address]         [facility]: [phone]  [facility]: [phone]                   [facility]: [phone]   Pre-screen negative, DM.   

## 2019-02-28 ENCOUNTER — Encounter: Payer: Self-pay | Admitting: Certified Nurse Midwife

## 2019-02-28 ENCOUNTER — Ambulatory Visit (INDEPENDENT_AMBULATORY_CARE_PROVIDER_SITE_OTHER): Payer: 59 | Admitting: Certified Nurse Midwife

## 2019-02-28 ENCOUNTER — Other Ambulatory Visit: Payer: Self-pay

## 2019-02-28 VITALS — BP 94/58 | HR 55 | Ht 63.0 in | Wt 161.6 lb

## 2019-02-28 DIAGNOSIS — E8941 Symptomatic postprocedural ovarian failure: Secondary | ICD-10-CM

## 2019-02-28 DIAGNOSIS — N393 Stress incontinence (female) (male): Secondary | ICD-10-CM | POA: Diagnosis not present

## 2019-02-28 NOTE — Progress Notes (Signed)
GYN ENCOUNTER NOTE  Subjective:       Kelly Banks is a 48 y.o. [phone] female here to discuss Estrace use while taking Lipitor.   Started Lipitor approximately six (6) weeks ago.   Has been taking one (1) to two (2)  mg PO Estrace for the last 10 years. Taking one (1) mg PO daily for the last two (2) weeks with good relief of symptoms-hot flashes and insomnia.   Reports occasional leakage of urine with sneezing and coughing.   Denies difficulty breathing or respiratory distress, chest pain, abdominal pain, vaginal bleeding, dysuria, and leg pain or swelling.   History of 20 year smoker, stopped two (2) years ago.    Gynecologic History  No LMP recorded. Patient has had a hysterectomy.  Contraception: status post hysterectomy  Last Pap: N/A  Last mammogram: 2017. Results were: normal per patient.   Obstetric History OB History  Gravida Para Term Preterm AB Living  4 2 2  0 2 1  SAB TAB Ectopic Multiple Live Births  0 2 0 0 2    # Outcome Date GA Lbr Len/2nd Weight Sex Delivery Anes PTL Lv  4 TAB 2008          3 Term 08/12/94   7 lb 0.9 oz (3.2 kg) M Vag-Spont None N LIV  2 Term 01/15/93   5 lb 15.2 oz (2.7 kg) M Vag-Spont None N DEC  1 TAB 1988            Past Medical History:  Diagnosis Date  . Hot flashes   . Hyperlipidemia   . Vitamin D deficiency     Past Surgical History:  Procedure Laterality Date  . ABDOMINAL HYSTERECTOMY  2010  . APPENDECTOMY  1992  . CHOLECYSTECTOMY  2017    Current Outpatient Medications on File Prior to Visit  Medication Sig Dispense Refill  . atorvastatin (LIPITOR) 40 MG tablet Take 1 tablet (40 mg total) by mouth daily. For cholesterol. 90 tablet 0  . estradiol (ESTRACE) 2 MG tablet TAKE 1 TABLET BY MOUTH ONCE A DAY (Patient taking differently: Take 1 mg by mouth daily. ) 90 tablet 0  . Glucosamine HCl (GLUCOSAMINE PO) Take 2 tablets by mouth daily.    . Multiple Vitamins-Minerals (MULTIVITAMIN WITH MINERALS) tablet Take 1  tablet by mouth daily.     No current facility-administered medications on file prior to visit.     Allergies  Allergen Reactions  . Procaine Palpitations    Social History   Socioeconomic History  . Marital status: Married    Spouse name: Not on file  . Number of children: Not on file  . Years of education: Not on file  . Highest education level: Not on file  Occupational History  . Not on file  Social Needs  . Financial resource strain: Not on file  . Food insecurity    Worry: Not on file    Inability: Not on file  . Transportation needs    Medical: Not on file    Non-medical: Not on file  Tobacco Use  . Smoking status: Former Research scientist (life sciences)  . Smokeless tobacco: Never Used  Substance and Sexual Activity  . Alcohol use: No  . Drug use: Never  . Sexual activity: Yes    Birth control/protection: Surgical  Lifestyle  . Physical activity    Days per week: Not on file    Minutes per session: Not on file  . Stress: Not on file  Relationships  .  Social Herbalist on phone: Not on file    Gets together: Not on file    Attends religious service: Not on file    Active member of club or organization: Not on file    Attends meetings of clubs or organizations: Not on file    Relationship status: Not on file  . Intimate partner violence    Fear of current or ex partner: Not on file    Emotionally abused: Not on file    Physically abused: Not on file    Forced sexual activity: Not on file  Other Topics Concern  . Not on file  Social History Narrative   Married.   Moved from [place].   Enjoys painting.    Family History  Problem Relation Age of Onset  . Hypertension Mother   . Arthritis Mother   . Breast cancer Neg Hx   . Ovarian cancer Neg Hx   . Colon cancer Neg Hx     The following portions of the patient's history were reviewed and updated as appropriate: allergies, current medications, past family history, past medical history, past social history, past  surgical history and problem list.  Review of Systems  Review of Systems - Negative except as noted above.  Information obtained from patient.   Objective:   BP (!) 94/58   Pulse (!) 55   Ht 5\' 3"  (1.6 m)   Wt 161 lb 9.6 oz (73.3 kg)   BMI 28.63 kg/m   CONSTITUTIONAL: Well-developed, well-nourished female in no acute distress.   HENT:  Normocephalic, atraumatic.   NECK: Normal range of motion, supple, no masses.  Normal thyroid.   SKIN: Skin is warm and dry. No rash noted. Not diaphoretic. No erythema. No pallor.  [place]: Alert and oriented to person, place, and time.   PSYCHIATRIC: Normal mood and affect. Normal behavior. Normal judgment and thought content.  PELVIC EXAM: Deferred, no complaints.   MUSCULOSKELETAL: Normal range of motion. No tenderness.  No cyanosis, clubbing, or edema. No signs of DVT on exam.   Assessment:   1. Hot flashes due to surgical menopause   2. Stress incontinence   Plan:   Advised she may continue Estrace as long as she doesn't smoke and continues mammograms regularly. Next mammogram schedule tomorrow per patient.   Suggested lung scan due to history of smoking, will discuss with PCP.   Reviewed red flag symptoms and when to call.   RTC as needed.    Diona Fanti, CNM [facility], [facility] 02/28/19 3:52 PM

## 2019-02-28 NOTE — Progress Notes (Signed)
Patient here to discuss Estrace use while on Lipitor.

## 2019-02-28 NOTE — Patient Instructions (Addendum)
Urinary Incontinence  Urinary incontinence refers to a condition in which a person is unable to control where and when to pass urine. A person with this condition will urinate when he or she does not mean to (involuntarily). What are the causes? This condition may be caused by:  Medicines.  Infections.  Constipation.  Overactive bladder muscles.  Weak bladder muscles.  Weak pelvic floor muscles. These muscles provide support for the bladder, intestine, and, in women, the uterus.  Enlarged prostate in men. The prostate is a gland near the bladder. When it gets too big, it can pinch the urethra. With the urethra blocked, the bladder can weaken and lose the ability to empty properly.  Surgery.  Emotional factors, such as anxiety, stress, or post-traumatic stress disorder (PTSD).  Pelvic organ prolapse. This happens in women when organs shift out of place and into the vagina. This shift can prevent the bladder and urethra from working properly. What increases the risk? The following factors may make you more likely to develop this condition:  Older age.  Obesity and physical inactivity.  Pregnancy and childbirth.  Menopause.  Diseases that affect the nerves or spinal cord (neurological diseases).  Long-term (chronic) coughing. This can increase pressure on the bladder and pelvic floor muscles. What are the signs or symptoms? Symptoms may vary depending on the type of urinary incontinence you have. They include:  A sudden urge to urinate, but passing urine involuntarily before you can get to a bathroom (urge incontinence).  Suddenly passing urine with any activity that forces urine to pass, such as coughing, laughing, exercise, or sneezing (stress incontinence).  Needing to urinate often, but urinating only a small amount, or constantly dribbling urine (overflow incontinence).  Urinating because you cannot get to the bathroom in time due to a physical disability, such as  arthritis or injury, or communication and thinking problems, such as Alzheimer disease (functional incontinence). How is this diagnosed? This condition may be diagnosed based on:  Your medical history.  A physical exam.  Tests, such as: ? Urine tests. ? X-rays of your kidney and bladder. ? Ultrasound. ? CT scan. ? Cystoscopy. In this procedure, a health care provider inserts a tube with a light and camera (cystoscope) through the urethra and into the bladder in order to check for problems. ? Urodynamic testing. These tests assess how well the bladder, urethra, and sphincter can store and release urine. There are different types of urodynamic tests, and they vary depending on what the test is measuring. To help diagnose your condition, your health care provider may recommend that you keep a log of when you urinate and how much you urinate. How is this treated? Treatment for this condition depends on the type of incontinence that you have and its cause. Treatment may include:  Lifestyle changes, such as: ? Quitting smoking. ? Maintaining a healthy weight. ? Staying active. Try to get 150 minutes of moderate-intensity exercise every week. Ask your health care provider which activities are safe for you. ? Eating a healthy diet.  Avoid high-fat foods, like fried foods.  Avoid refined carbohydrates like white bread and white rice.  Limit how much alcohol and caffeine you drink.  Increase your fiber intake. Foods such as fresh fruits, vegetables, beans, and whole grains are healthy sources of fiber.  Pelvic floor muscle exercises.  Bladder training, such as lengthening the amount of time between bathroom breaks, or using the bathroom at regular intervals.  Using techniques to suppress bladder urges.   This can include distraction techniques or controlled breathing exercises.  Medicines to relax the bladder muscles and prevent bladder spasms.  Medicines to help slow or prevent the  growth of a man's prostate.  Botox injections. These can help relax the bladder muscles.  Using pulses of electricity to help change bladder reflexes (electrical nerve stimulation).  For women, using a medical device to prevent urine leaks. This is a small, tampon-like, disposable device that is inserted into the urethra.  Injecting collagen or carbon beads (bulking agents) into the urinary sphincter. These can help thicken tissue and close the bladder opening.  Surgery. Follow these instructions at home: Lifestyle  Limit alcohol and caffeine. These can fill your bladder quickly and irritate it.  Keep yourself clean to help prevent odors and skin damage. Ask your doctor about special skin creams and cleansers that can protect the skin from urine.  Consider wearing pads or adult diapers. Make sure to change them regularly, and always change them right after experiencing incontinence. General instructions  Take over-the-counter and prescription medicines only as told by your health care provider.  Use the bathroom about every 3-4 hours, even if you do not feel the need to urinate. Try to empty your bladder completely every time. After urinating, wait a minute. Then try to urinate again.  Make sure you are in a relaxed position while urinating.  If your incontinence is caused by nerve problems, keep a log of the medicines you take and the times you go to the bathroom.  Keep all follow-up visits as told by your health care provider. This is important. Contact a health care provider if:  You have pain that gets worse.  Your incontinence gets worse. Get help right away if:  You have a fever or chills.  You are unable to urinate.  You have redness in your groin area or down your legs. Summary  Urinary incontinence refers to a condition in which a person is unable to control where and when to pass urine.  This condition may be caused by medicines, infection, weak bladder  muscles, weak pelvic floor muscles, enlargement of the prostate (in men), or surgery.  The following factors increase your risk for developing this condition: older age, obesity, pregnancy and childbirth, menopause, neurological diseases, and chronic coughing.  There are several types of urinary incontinence. They include urge incontinence, stress incontinence, overflow incontinence, and functional incontinence.  This condition is usually treated first with lifestyle and behavioral changes, such as quitting smoking, eating a healthier diet, and doing regular pelvic floor exercises. Other treatment options include medicines, bulking agents, medical devices, electrical nerve stimulation, or surgery. This information is not intended to replace advice given to you by your health care provider. Make sure you discuss any questions you have with your health care provider. Document Released: 09/18/2004 Document Revised: 08/21/2017 Document Reviewed: 11/20/2016 Elsevier Patient Education  [employer]. Estradiol tablets What is this medicine? ESTRADIOL (es tra DYE ole) is an estrogen. It is mostly used as hormone replacement in menopausal women. It helps to treat hot flashes and prevent osteoporosis. It is also used to treat women with low estrogen levels or those who have had their ovaries removed. This medicine may be used for other purposes; ask your health care provider or pharmacist if you have questions. COMMON BRAND NAME(S): Estrace, Femtrace, Gynodiol What should I tell my health care provider before I take this medicine? They need to know if you have or ever had any of  these conditions:  abnormal vaginal bleeding  blood vessel disease or blood clots  breast, cervical, endometrial, ovarian, liver, or uterine cancer  dementia  diabetes  gallbladder disease  heart disease or recent heart attack  high blood pressure  high cholesterol  high level of calcium in the blood   hysterectomy  kidney disease  liver disease  migraine headaches  protein C deficiency  protein S deficiency  stroke  systemic lupus erythematosus (SLE)  tobacco smoker  an unusual or allergic reaction to estrogens, other hormones, medicines, foods, dyes, or preservatives  pregnant or trying to get pregnant  breast-feeding How should I use this medicine? Take this medicine by mouth. To reduce nausea, this medicine may be taken with food. Follow the directions on the prescription label. Take this medicine at the same time each day and in the order directed on the package. Do not take your medicine more often than directed. Contact your pediatrician regarding the use of this medicine in children. Special care may be needed. A patient package insert for the product will be given with each prescription and refill. Read this sheet carefully each time. The sheet may change frequently. Overdosage: If you think you have taken too much of this medicine contact a poison control center or emergency room at once. NOTE: This medicine is only for you. Do not share this medicine with others. What if I miss a dose? If you miss a dose, take it as soon as you can. If it is almost time for your next dose, take only that dose. Do not take double or extra doses. What may interact with this medicine? Do not take this medicine with any of the following medications:  aromatase inhibitors like aminoglutethimide, anastrozole, exemestane, letrozole, testolactone This medicine may also interact with the following medications:  carbamazepine  certain antibiotics used to treat infections  certain barbiturates or benzodiazepines used for inducing sleep or treating seizures  grapefruit juice  medicines for fungus infections like itraconazole and ketoconazole  raloxifene or tamoxifen  rifabutin, rifampin, or rifapentine  ritonavir  St. John's Wort  warfarin This list may not describe all  possible interactions. Give your health care provider a list of all the medicines, herbs, non-prescription drugs, or dietary supplements you use. Also tell them if you smoke, drink alcohol, or use illegal drugs. Some items may interact with your medicine. What should I watch for while using this medicine? Visit your doctor or health care professional for regular checks on your progress. You will need a regular breast and pelvic exam and Pap smear while on this medicine. You should also discuss the need for regular mammograms with your health care professional, and follow his or her guidelines for these tests. This medicine can make your body retain fluid, making your fingers, hands, or ankles swell. Your blood pressure can go up. Contact your doctor or health care professional if you feel you are retaining fluid. If you have any reason to think you are pregnant, stop taking this medicine right away and contact your doctor or health care professional. Smoking increases the risk of getting a blood clot or having a stroke while you are taking this medicine, especially if you are more than 50 years old. You are strongly advised not to smoke. If you wear contact lenses and notice visual changes, or if the lenses begin to feel uncomfortable, consult your eye doctor or health care professional. This medicine can increase the risk of developing a condition (endometrial hyperplasia) that  may lead to cancer of the lining of the uterus. Taking progestins, another hormone drug, with this medicine lowers the risk of developing this condition. Therefore, if your uterus has not been removed (by a hysterectomy), your doctor may prescribe a progestin for you to take together with your estrogen. You should know, however, that taking estrogens with progestins may have additional health risks. You should discuss the use of estrogens and progestins with your health care professional to determine the benefits and risks for you.  If you are going to have surgery, you may need to stop taking this medicine. Consult your health care professional for advice before you schedule the surgery. What side effects may I notice from receiving this medicine? Side effects that you should report to your doctor or health care professional as soon as possible:  allergic reactions like skin rash, itching or hives, swelling of the face, lips, or tongue  breast tissue changes or discharge  changes in vision  chest pain  confusion, trouble speaking or understanding  dark urine  general ill feeling or flu-like symptoms  light-colored stools  nausea, vomiting  pain, swelling, warmth in the leg  right upper belly pain  severe headaches  shortness of breath  sudden numbness or weakness of the face, arm or leg  trouble walking, dizziness, loss of balance or coordination  unusual vaginal bleeding  yellowing of the eyes or skin Side effects that usually do not require medical attention (report to your doctor or health care professional if they continue or are bothersome):  hair loss  increased hunger or thirst  increased urination  symptoms of vaginal infection like itching, irritation or unusual discharge  unusually weak or tired This list may not describe all possible side effects. Call your doctor for medical advice about side effects. You may report side effects to FDA at 1-800-FDA-1088. Where should I keep my medicine? Keep out of the reach of children. Store at room temperature between 20 and 25 degrees C (68 and 77 degrees F). Keep container tightly closed. Protect from light. Throw away any unused medicine after the expiration date. NOTE: This sheet is a summary. It may not cover all possible information. If you have questions about this medicine, talk to your doctor, pharmacist, or health care provider.  2020 Elsevier/Gold Standard (2010-11-13 21:30:86)  Kegel Exercises  Kegel exercises can help  strengthen your pelvic floor muscles. The pelvic floor is a group of muscles that support your rectum, small intestine, and bladder. In females, pelvic floor muscles also help support the womb (uterus). These muscles help you control the flow of urine and stool. Kegel exercises are painless and simple, and they do not require any equipment. Your provider may suggest Kegel exercises to:  Improve bladder and bowel control.  Improve sexual response.  Improve weak pelvic floor muscles after surgery to remove the uterus (hysterectomy) or pregnancy (females).  Improve weak pelvic floor muscles after prostate gland removal or surgery (males). Kegel exercises involve squeezing your pelvic floor muscles, which are the same muscles you squeeze when you try to stop the flow of urine or keep from passing gas. The exercises can be done while sitting, standing, or lying down, but it is best to vary your position. Exercises How to do Kegel exercises: 1. Squeeze your pelvic floor muscles tight. You should feel a tight lift in your rectal area. If you are a female, you should also feel a tightness in your vaginal area. Keep your stomach, buttocks, and  legs relaxed. 2. Hold the muscles tight for up to 10 seconds. 3. Breathe normally. 4. Relax your muscles. 5. Repeat as told by your health care provider. Repeat this exercise daily as told by your health care provider. Continue to do this exercise for at least 4-6 weeks, or for as long as told by your health care provider. You may be referred to a physical therapist who can help you learn more about how to do Kegel exercises. Depending on your condition, your health care provider may recommend:  Varying how long you squeeze your muscles.  Doing several sets of exercises every day.  Doing exercises for several weeks.  Making Kegel exercises a part of your regular exercise routine. This information is not intended to replace advice given to you by your health  care provider. Make sure you discuss any questions you have with your health care provider. Document Released: 07/28/2012 Document Revised: 03/31/2018 Document Reviewed: 03/31/2018 Elsevier Patient Education  2020 [employer].

## 2019-03-01 ENCOUNTER — Ambulatory Visit
Admission: RE | Admit: 2019-03-01 | Discharge: 2019-03-01 | Disposition: A | Discharge status: Home / Self Care | Payer: 59 | Source: Ambulatory Visit | Attending: Primary Care | Admitting: Primary Care

## 2019-03-01 DIAGNOSIS — Z1239 Encounter for other screening for malignant neoplasm of breast: Secondary | ICD-10-CM | POA: Diagnosis present

## 2019-03-01 DIAGNOSIS — Z1231 Encounter for screening mammogram for malignant neoplasm of breast: Secondary | ICD-10-CM | POA: Insufficient documentation

## 2019-03-10 ENCOUNTER — Telehealth: Payer: Self-pay

## 2019-03-10 NOTE — Telephone Encounter (Signed)
Left message to call clinic, needs COVID screen and back door lab info   

## 2019-03-14 ENCOUNTER — Other Ambulatory Visit (INDEPENDENT_AMBULATORY_CARE_PROVIDER_SITE_OTHER): Payer: 59

## 2019-03-14 ENCOUNTER — Other Ambulatory Visit: Payer: Self-pay

## 2019-03-14 DIAGNOSIS — E785 Hyperlipidemia, unspecified: Secondary | ICD-10-CM | POA: Diagnosis not present

## 2019-03-14 LAB — LIPID PANEL
Cholesterol: 167 mg/dL (ref 0–200)
HDL: 50.8 mg/dL (ref >39.00)
LDL Cholesterol: 88 mg/dL (ref 0–99)
NonHDL: 116.23
Total CHOL/HDL Ratio: 3
Triglycerides: 143 mg/dL (ref 0.0–149.0)
VLDL: 28.6 mg/dL (ref 0.0–40.0)

## 2019-06-14 ENCOUNTER — Other Ambulatory Visit: Payer: Self-pay | Admitting: Primary Care

## 2019-06-14 DIAGNOSIS — N951 Menopausal and female climacteric states: Secondary | ICD-10-CM

## 2019-06-14 NOTE — Telephone Encounter (Signed)
Last prescribed on 02/02/2019 . Last appointment on 01/18/2019. No future appointment

## 2019-06-15 NOTE — Telephone Encounter (Signed)
Noted. Evaluated by GYN in July 2020 who recommended continuation of estradiol. Mammogram in July 2020 UTD, negative. Continue with annual mammograms and lipid monitoring.

## 2019-07-20 ENCOUNTER — Other Ambulatory Visit: Payer: Self-pay

## 2019-07-20 ENCOUNTER — Ambulatory Visit (INDEPENDENT_AMBULATORY_CARE_PROVIDER_SITE_OTHER): Payer: 59 | Admitting: Family Medicine

## 2019-07-20 ENCOUNTER — Encounter: Payer: Self-pay | Admitting: Family Medicine

## 2019-07-20 VITALS — BP 118/70 | HR 46 | Temp 98.3°F | Ht 63.75 in | Wt 168.0 lb

## 2019-07-20 DIAGNOSIS — R1011 Right upper quadrant pain: Secondary | ICD-10-CM | POA: Diagnosis not present

## 2019-07-20 DIAGNOSIS — G2581 Restless legs syndrome: Secondary | ICD-10-CM | POA: Diagnosis not present

## 2019-07-20 DIAGNOSIS — Z9049 Acquired absence of other specified parts of digestive tract: Secondary | ICD-10-CM | POA: Diagnosis not present

## 2019-07-20 HISTORY — DX: Right upper quadrant pain: R10.11

## 2019-07-20 LAB — COMPREHENSIVE METABOLIC PANEL
ALT: 17 U/L (ref 0–35)
AST: 17 U/L (ref 0–37)
Albumin: 3.9 g/dL (ref 3.5–5.2)
Alkaline Phosphatase: 54 U/L (ref 39–117)
BUN: 13 mg/dL (ref 6–23)
CO2: 28 mEq/L (ref 19–32)
Calcium: 9.9 mg/dL (ref 8.4–10.5)
Chloride: 103 mEq/L (ref 96–112)
Creatinine, Ser: 0.93 mg/dL (ref 0.40–1.20)
GFR: 63.63 mL/min (ref >60.00)
Glucose, Bld: 90 mg/dL (ref 70–99)
Potassium: 4.4 mEq/L (ref 3.5–5.1)
Sodium: 137 mEq/L (ref 135–145)
Total Bilirubin: 0.5 mg/dL (ref 0.2–1.2)
Total Protein: 7.1 g/dL (ref 6.0–8.3)

## 2019-07-20 LAB — LIPASE: Lipase: 33 U/L (ref 11.0–59.0)

## 2019-07-20 LAB — MAGNESIUM: Magnesium: 1.7 mg/dL (ref 1.5–2.5)

## 2019-07-20 MED ORDER — SUCRALFATE 1 G PO TABS: 1.0000 g | ORAL_TABLET | Freq: Three times a day (TID) | ORAL | 0 refills | Status: DC

## 2019-07-20 NOTE — Assessment & Plan Note (Addendum)
Pt has already had her GB removed, but notes this pain is similar. Notes hx of esophagitis with improvement with omeprazole. Will get labs to look at liver function and pancreatitis. Encouraged following low fat diet and start carafate for possible esophagitis. If recurring may need GI referral for additional evaluation for biliary sludge causes. ER precautions discussed

## 2019-07-20 NOTE — Assessment & Plan Note (Signed)
Unable to do exam of foot due to time. However, will get magnesium and advised stretching and handout provided for restless legs. F/u with pcp if no improvement.

## 2019-07-20 NOTE — Patient Instructions (Signed)
#  Abdominal pain - Labs today - ER - if you get worsening pain, fevers, or cannot eat or drink anything - Continue Omeprazole - Avoid pain medicine like Ibuprofen or Aleve - OK to take tylenol - Start Carafate medication   Restless Legs Syndrome How is this treated? This condition is treated by managing the symptoms. This may include:  Lifestyle changes, such as exercising, using relaxation techniques, and avoiding caffeine, alcohol, or tobacco.  Medicines. Anti-seizure medicines may be tried first. Follow these instructions at home  General instructions  Take over-the-counter and prescription medicines only as told by your health care provider.  Use methods to help relieve the uncomfortable sensations, such as: ? Massaging your legs. ? Walking or stretching. ? Taking a cold or hot bath.  Keep all follow-up visits as told by your health care provider. This is important. Lifestyle  Practice good sleep habits. For example, go to bed and get up at the same time every day. Most adults should get 7-9 hours of sleep each night.  Exercise regularly. Try to get at least 30 minutes of exercise most days of the week.  Practice ways of relaxing, such as yoga or meditation.  Avoid caffeine and alcohol.  Do not use any products that contain nicotine or tobacco, such as cigarettes and e-cigarettes. If you need help quitting, ask your health care provider. Contact a health care provider if:  Your symptoms get worse or they do not improve with treatment. Summary  Restless legs syndrome is a condition that causes uncomfortable feelings or sensations in the legs, especially while sitting or lying down.  The symptoms often interfere with a person's ability to sleep.  This condition is treated by managing the symptoms. You may need to make lifestyle changes or take medicines. This information is not intended to replace advice given to you by your health care provider. Make sure you  discuss any questions you have with your health care provider. Document Released: 08/01/2002 Document Revised: 08/31/2017 Document Reviewed: 08/31/2017 Elsevier Patient Education  2020 [employer].

## 2019-07-20 NOTE — Progress Notes (Signed)
Subjective:     Kelly Banks is a 48 y.o. female presenting for RUQ Pain (Started a few days ago. Does not have her gallbladder but it is similar pain. Worse after she eats. Normal BMs.)     Abdominal Pain This is a new problem. The current episode started in the past 7 days. The problem occurs constantly. The problem has been unchanged. The pain is located in the RUQ. The quality of the pain is dull. The abdominal pain radiates to the back. Pertinent negatives include no belching, constipation, diarrhea, dysuria, fever, flatus, nausea or vomiting. The pain is aggravated by eating. The pain is relieved by nothing. She has tried acetaminophen for the symptoms. The treatment provided moderate relief.   Feels like the gallbladder pain she had in the past  Also notes some numbness in her left foot at night as well as inability for the feet to rest and needing to move.   Review of Systems  Constitutional: Negative for fever.  Gastrointestinal: Positive for abdominal pain. Negative for constipation, diarrhea, flatus, nausea and vomiting.  Genitourinary: Negative for dysuria.     Social History   Tobacco Use  Smoking Status Former Smoker  Smokeless Tobacco Never Used        Objective:    BP Readings from Last 3 Encounters:  07/20/19 118/70  02/28/19 (!) 94/58  01/18/19 104/64   Wt Readings from Last 3 Encounters:  07/20/19 168 lb (76.2 kg)  02/28/19 161 lb 9.6 oz (73.3 kg)  01/18/19 160 lb 12 oz (72.9 kg)    BP 118/70 (BP Location: Left Arm, Patient Position: Sitting, Cuff Size: Normal)   Pulse (!) 46   Temp 98.3 F (36.8 C)   Ht 5' 3.75" (1.619 m)   Wt 168 lb (76.2 kg)   SpO2 99%   BMI 29.06 kg/m    Physical Exam Constitutional:      General: She is not in acute distress.    Appearance: She is well-developed. She is not diaphoretic.  HENT:     Right Ear: External ear normal.     Left Ear: External ear normal.     Nose: Nose normal.  Eyes:   Conjunctiva/sclera: Conjunctivae normal.  Neck:     Musculoskeletal: Neck supple.  Cardiovascular:     Rate and Rhythm: Normal rate and regular rhythm.     Heart sounds: No murmur.  Pulmonary:     Effort: Pulmonary effort is normal. No respiratory distress.     Breath sounds: Normal breath sounds. No wheezing.  Abdominal:     General: Abdomen is flat. Bowel sounds are normal. There is no distension.     Palpations: Abdomen is soft. There is no hepatomegaly or splenomegaly.     Tenderness: There is abdominal tenderness in the right upper quadrant. There is no guarding or rebound. Negative signs include Murphy's sign.  Skin:    General: Skin is warm and dry.     Capillary Refill: Capillary refill takes less than 2 seconds.  Neurological:     Mental Status: She is alert. Mental status is at baseline.  Psychiatric:        Mood and Affect: Mood normal.        Behavior: Behavior normal.           Assessment & Plan:   Problem List Items Addressed This Visit      Other   RUQ abdominal pain - Primary    Pt has already had her GB  removed, but notes this pain is similar. Notes hx of esophagitis with improvement with omeprazole. Will get labs to look at liver function and pancreatitis. Encouraged following low fat diet and start carafate for possible esophagitis. If recurring may need GI referral for additional evaluation for biliary sludge causes. ER precautions discussed      Relevant Medications   sucralfate (CARAFATE) 1 g tablet   Other Relevant Orders   Comprehensive metabolic panel   Lipase   Magnesium   Restless legs syndrome (RLS)    Unable to do exam of foot due to time. However, will get magnesium and advised stretching and handout provided for restless legs. F/u with pcp if no improvement.       Relevant Orders   Magnesium    Other Visit Diagnoses    S/P cholecystectomy         This visit occurred during the SARS-CoV-2 public health emergency.  Safety protocols  were in place, including screening questions prior to the visit, additional usage of staff PPE, and extensive cleaning of exam room while observing appropriate contact time as indicated for disinfecting solutions.     Return if symptoms worsen or fail to improve.  Lesleigh Noe, MD

## 2019-09-19 ENCOUNTER — Telehealth: Payer: Self-pay

## 2019-09-19 NOTE — Telephone Encounter (Signed)
Kelly Banks left v/m and wants to know if it is OK for pt to get covid vaccine and if so how to schedule. Pt is 49 y.o.Please advise.

## 2019-09-19 NOTE — Telephone Encounter (Signed)
Please notify patient that I do recommend the Covid-19 vaccine but she will have to follow the guidelines for vaccination timeline per CDC. I recommend she check back with [place] in one month to see if we have any updates.

## 2019-09-20 NOTE — Telephone Encounter (Signed)
Message left for patient's husband to return my call.  

## 2019-09-20 NOTE — Telephone Encounter (Signed)
Spoken and notified patient's husband of Kelly Millers comments. Patient's husband verbalized understanding.

## 2019-10-25 ENCOUNTER — Ambulatory Visit: Payer: 59 | Attending: Internal Medicine

## 2019-10-25 DIAGNOSIS — Z23 Encounter for immunization: Secondary | ICD-10-CM | POA: Insufficient documentation

## 2019-10-25 NOTE — Progress Notes (Signed)
   Covid-19 Vaccination Clinic  Name:  Kelly Banks    MRN: [id number] DOB: July 01, 1971  10/25/2019  [name] was observed post Covid-19 immunization for 15 minutes without incident. She was provided with Vaccine Information Sheet and instruction to access the V-Safe system.   [name] was instructed to call 911 with any severe reactions post vaccine: Marland Kitchen Difficulty breathing  . Swelling of face and throat  . A fast heartbeat  . A bad rash all over body  . Dizziness and weakness   Immunizations Administered    Name Date Dose VIS Date Route   Pfizer COVID-19 Vaccine 10/25/2019 12:29 PM 0.3 mL 08/05/2019 Intramuscular   Manufacturer: [place]   Lot: [id number]   [place]: [id number]

## 2019-11-08 ENCOUNTER — Other Ambulatory Visit: Payer: Self-pay | Admitting: Primary Care

## 2019-11-08 DIAGNOSIS — N951 Menopausal and female climacteric states: Secondary | ICD-10-CM

## 2019-11-08 NOTE — Telephone Encounter (Signed)
She needs general follow up visit to continue medications. I'll send a 90 day supply now but she needs to be seen for further refills. Please have her scheduled.

## 2019-11-08 NOTE — Telephone Encounter (Signed)
Last prescribed on 06/15/2019 . Last appointment on 07/20/2019 with Dr Einar Pheasant for acute. No future appointment

## 2019-11-09 NOTE — Telephone Encounter (Signed)
Message left for patient to return my call.  

## 2019-11-11 NOTE — Telephone Encounter (Signed)
Message left for patient to return my call.  

## 2019-11-15 ENCOUNTER — Ambulatory Visit: Payer: 59 | Attending: Internal Medicine

## 2019-11-15 DIAGNOSIS — Z23 Encounter for immunization: Secondary | ICD-10-CM

## 2019-11-15 NOTE — Progress Notes (Signed)
   Covid-19 Vaccination Clinic  Name:  Kelly Banks    MRN: [id number] DOB: Feb 14, 1971  11/15/2019  [name] was observed post Covid-19 immunization for 15 minutes without incident. She was provided with Vaccine Information Sheet and instruction to access the V-Safe system.   Ms. Brush was instructed to call 911 with any severe reactions post vaccine: Marland Kitchen Difficulty breathing  . Swelling of face and throat  . A fast heartbeat  . A bad rash all over body  . Dizziness and weakness   Immunizations Administered    Name Date Dose VIS Date Route   Pfizer COVID-19 Vaccine 11/15/2019  1:16 PM 0.3 mL 08/05/2019 Intramuscular   Manufacturer: [place]   Lot: [id number]   [place]: [id number]

## 2019-11-16 NOTE — Telephone Encounter (Signed)
Left message for patient to call back to be scheduled.

## 2019-11-18 NOTE — Telephone Encounter (Signed)
Sending letter requesting patient to call office to schedule follow up appointment

## 2020-02-22 ENCOUNTER — Other Ambulatory Visit: Payer: Self-pay | Admitting: Primary Care

## 2020-02-22 DIAGNOSIS — N951 Menopausal and female climacteric states: Secondary | ICD-10-CM

## 2020-02-22 NOTE — Telephone Encounter (Signed)
Last prescribed on 11/08/2019 Last OV (acute) with Allie Bossier on 01/18/2019 No future OV scheduled

## 2020-02-22 NOTE — Telephone Encounter (Signed)
Please notify patient that she is overdue for a follow up visit with me. She will need to be seen for further refills. I can provide her with a 30 day refill for now. Let me know when she's scheduled.

## 2020-02-22 NOTE — Telephone Encounter (Signed)
Message stated that this number is not accepting call at the moment and VM is not set up to not accepting calls

## 2020-02-28 NOTE — Telephone Encounter (Signed)
Left message for patient to call office to schedule an appointment.

## 2020-03-05 ENCOUNTER — Ambulatory Visit (INDEPENDENT_AMBULATORY_CARE_PROVIDER_SITE_OTHER): Payer: 59 | Admitting: Primary Care

## 2020-03-05 ENCOUNTER — Other Ambulatory Visit: Payer: Self-pay

## 2020-03-05 ENCOUNTER — Encounter: Payer: Self-pay | Admitting: Primary Care

## 2020-03-05 VITALS — BP 120/74 | HR 62 | Temp 96.2°F | Ht 63.75 in | Wt 171.0 lb

## 2020-03-05 DIAGNOSIS — N951 Menopausal and female climacteric states: Secondary | ICD-10-CM | POA: Diagnosis not present

## 2020-03-05 DIAGNOSIS — E785 Hyperlipidemia, unspecified: Secondary | ICD-10-CM

## 2020-03-05 DIAGNOSIS — M7989 Other specified soft tissue disorders: Secondary | ICD-10-CM | POA: Diagnosis not present

## 2020-03-05 DIAGNOSIS — Z1231 Encounter for screening mammogram for malignant neoplasm of breast: Secondary | ICD-10-CM | POA: Diagnosis not present

## 2020-03-05 DIAGNOSIS — Z79890 Hormone replacement therapy (postmenopausal): Secondary | ICD-10-CM

## 2020-03-05 LAB — CBC
HCT: 38.7 % (ref 36.0–46.0)
Hemoglobin: 12.9 g/dL (ref 12.0–15.0)
MCHC: 33.4 g/dL (ref 30.0–36.0)
MCV: 90.1 fl (ref 78.0–100.0)
Platelets: 283 10*3/uL (ref 150.0–400.0)
RBC: 4.3 Mil/uL (ref 3.87–5.11)
RDW: 13.5 % (ref 11.5–15.5)
WBC: 6.2 10*3/uL (ref 4.0–10.5)

## 2020-03-05 LAB — COMPREHENSIVE METABOLIC PANEL
ALT: 17 U/L (ref 0–35)
AST: 20 U/L (ref 0–37)
Albumin: 4.3 g/dL (ref 3.5–5.2)
Alkaline Phosphatase: 57 U/L (ref 39–117)
BUN: 10 mg/dL (ref 6–23)
CO2: 28 mEq/L (ref 19–32)
Calcium: 9.8 mg/dL (ref 8.4–10.5)
Chloride: 103 mEq/L (ref 96–112)
Creatinine, Ser: 0.82 mg/dL (ref 0.40–1.20)
GFR: 73.39 mL/min (ref >60.00)
Glucose, Bld: 96 mg/dL (ref 70–99)
Potassium: 4.4 mEq/L (ref 3.5–5.1)
Sodium: 136 mEq/L (ref 135–145)
Total Bilirubin: 0.5 mg/dL (ref 0.2–1.2)
Total Protein: 7.2 g/dL (ref 6.0–8.3)

## 2020-03-05 LAB — LIPID PANEL
Cholesterol: 262 mg/dL — ABNORMAL HIGH (ref 0–200)
HDL: 52.7 mg/dL (ref >39.00)
LDL Cholesterol: 175 mg/dL — ABNORMAL HIGH (ref 0–99)
NonHDL: 208.99
Total CHOL/HDL Ratio: 5
Triglycerides: 168 mg/dL — ABNORMAL HIGH (ref 0.0–149.0)
VLDL: 33.6 mg/dL (ref 0.0–40.0)

## 2020-03-05 MED ORDER — ESTRADIOL 2 MG PO TABS: 2.0000 mg | ORAL_TABLET | Freq: Every day | ORAL | 0 refills | Status: DC

## 2020-03-05 NOTE — Assessment & Plan Note (Signed)
Chronic use of estradiol 2 mg, could not tolerate dose reduction in the past. Mammogram due, orders placed. Repeat lipid panel pending, discussed the need to potentially resume Lipitor if needed.  Discussed the risk for breast cancer and CAD with long term estrogen use, she verbalized understanding and prefers to continue use.   Three month refill provided. She will need to have her mammogram done within the next three months before we can provide additional refills.

## 2020-03-05 NOTE — Assessment & Plan Note (Signed)
Acute to the left hip, exam today consistent for cyst. Ultrasound soft tissue pending.

## 2020-03-05 NOTE — Patient Instructions (Signed)
Please schedule your mammogram as discussed. You must have this done before we can provide additional refills.  Stop by the lab prior to leaving today. I will notify you of your results once received.   You will be contacted regarding the ultrasound of your hip.  Please let us know if you have not been contacted within two weeks.   It was a pleasure to see you today!

## 2020-03-05 NOTE — Assessment & Plan Note (Signed)
Patient no longer taking Lipitor, she discontinued herself. Repeat lipids pending.  Discussed the need to resume if LDL above goal, she verbalized understanding.

## 2020-03-05 NOTE — Progress Notes (Signed)
Subjective:    Patient ID: Kelly Banks, female    DOB: 01-05-1971, 49 y.o.   MRN: [id number]  HPI  This visit occurred during the SARS-CoV-2 public health emergency.  Safety protocols were in place, including screening questions prior to the visit, additional usage of staff PPE, and extensive cleaning of exam room while observing appropriate contact time as indicated for disinfecting solutions.   Kelly Banks is a 49 year old female with a history of hyperlipidemia, vasomotor symptoms of menopause who presents today for follow up.  1) HRT: Currently managed on oral estradiol 2 mg for which she's been taking for years for vasomotor symptoms of menopause. Evaluated by GYN in July 2020, they approved continuation of oral estrogen as long as she refrained from tobacco abuse and continued annual mammograms.   She continues to take one tablet of estradiol daily, does very well on this regimen. We attempted to reduce her dose to 1 mg over one year ago but this was not tolerated well. Last mammogram was in July of 2020. She is no longer taking Lipitor.   2) Hyperlipidemia: Previously managed on Lipitor for which she's not taken for at least one year as her cholesterol "became normal". She denies chest pain, palpitations, shortness of breath.   BP Readings from Last 3 Encounters:  03/05/20 120/74  07/20/19 118/70  02/28/19 (!) 94/58   3) Soft Tissue Mass: Noticed to the left hip about three weeks ago. Tenderness with palpation. She denies injury/trauma, skin color changes.   Review of Systems  Respiratory: Negative for shortness of breath.   Cardiovascular: Negative for chest pain and palpitations.  Genitourinary: Negative for vaginal bleeding.  Skin: Negative for color change.       Skin mass       Past Medical History:  Diagnosis Date  . Hot flashes   . Hyperlipidemia   . Vitamin D deficiency      Social History   Socioeconomic History  . Marital status: Married    Spouse name:  Not on file  . Number of children: Not on file  . Years of education: Not on file  . Highest education level: Not on file  Occupational History  . Not on file  Tobacco Use  . Smoking status: Former Research scientist (life sciences)  . Smokeless tobacco: Never Used  Vaping Use  . Vaping Use: Never used  Substance and Sexual Activity  . Alcohol use: No  . Drug use: Never  . Sexual activity: Yes    Birth control/protection: Surgical  Other Topics Concern  . Not on file  Social History Narrative   Married.   Moved from [place].   Enjoys painting.   Social Determinants of Health   Financial Resource Strain:   . Difficulty of Paying Living Expenses:   Food Insecurity:   . Worried About Charity fundraiser in the Last Year:   . Arboriculturist in the Last Year:   Transportation Needs:   . Film/video editor (Medical):   Marland Kitchen Lack of Transportation (Non-Medical):   Physical Activity:   . Days of Exercise per Week:   . Minutes of Exercise per Session:   Stress:   . Feeling of Stress :   Social Connections:   . Frequency of Communication with Friends and Family:   . Frequency of Social Gatherings with Friends and Family:   . Attends Religious Services:   . Active Member of Clubs or Organizations:   . Attends Club or  Organization Meetings:   Marland Kitchen Marital Status:   Intimate Partner Violence:   . Fear of Current or Ex-Partner:   . Emotionally Abused:   Marland Kitchen Physically Abused:   . Sexually Abused:     Past Surgical History:  Procedure Laterality Date  . ABDOMINAL HYSTERECTOMY  2010  . APPENDECTOMY  1992  . CHOLECYSTECTOMY  2017    Family History  Problem Relation Age of Onset  . Hypertension Mother   . Arthritis Mother   . Breast cancer Neg Hx   . Ovarian cancer Neg Hx   . Colon cancer Neg Hx     Allergies  Allergen Reactions  . Procaine Palpitations    Current Outpatient Medications on File Prior to Visit  Medication Sig Dispense Refill  . Glucosamine HCl (GLUCOSAMINE PO) Take 2 tablets  by mouth daily.    . Multiple Vitamins-Minerals (MULTIVITAMIN WITH MINERALS) tablet Take 1 tablet by mouth daily.    . sucralfate (CARAFATE) 1 g tablet Take 1 tablet (1 g total) by mouth 4 (four) times daily -  with meals and at bedtime. 90 tablet 0   No current facility-administered medications on file prior to visit.    BP 120/74   Pulse 62   Temp (!) 96.2 F (35.7 C) (Temporal)   Ht 5' 3.75" (1.619 m)   Wt 171 lb (77.6 kg)   SpO2 98%   BMI 29.58 kg/m    Objective:   Physical Exam Cardiovascular:     Rate and Rhythm: Normal rate and regular rhythm.  Pulmonary:     Effort: Pulmonary effort is normal.     Breath sounds: Normal breath sounds.  Musculoskeletal:     Cervical back: Neck supple.  Skin:    General: Skin is warm and dry.     Comments: Small soft, immobile, tender mass to left lateral hip. Deep.   Neurological:     Mental Status: She is alert.            Assessment & Plan:

## 2020-03-13 ENCOUNTER — Ambulatory Visit
Admission: RE | Admit: 2020-03-13 | Discharge: 2020-03-13 | Disposition: A | Discharge status: Home / Self Care | Payer: 59 | Source: Ambulatory Visit | Attending: Primary Care | Admitting: Primary Care

## 2020-03-13 ENCOUNTER — Other Ambulatory Visit: Payer: Self-pay

## 2020-03-13 DIAGNOSIS — M7989 Other specified soft tissue disorders: Secondary | ICD-10-CM | POA: Diagnosis present

## 2020-03-16 ENCOUNTER — Other Ambulatory Visit: Payer: Self-pay | Admitting: Primary Care

## 2020-03-16 DIAGNOSIS — M7989 Other specified soft tissue disorders: Secondary | ICD-10-CM

## 2020-04-06 ENCOUNTER — Telehealth: Payer: Self-pay

## 2020-04-06 DIAGNOSIS — E785 Hyperlipidemia, unspecified: Secondary | ICD-10-CM

## 2020-04-06 NOTE — Telephone Encounter (Signed)
Pt's husband called stating that pt had restarted Lipitor and developed RUQ abd pain.... pain did go away 3 days after stopping medication.... husband states pt would like to know if she should try a different statin.... Is aware that Anda Kraft is out of the office and will be returning 04/11/20

## 2020-04-09 ENCOUNTER — Ambulatory Visit: Payer: 59

## 2020-04-11 MED ORDER — ROSUVASTATIN CALCIUM 5 MG PO TABS: 5.0000 mg | ORAL_TABLET | Freq: Every evening | ORAL | 3 refills | Status: DC

## 2020-04-11 NOTE — Telephone Encounter (Signed)
Please notify patient and husband that we can try rosuvastatin (Crestor) to see if this is less painful.  Are they agreeable? If so then I'll send to the pharmacy.

## 2020-04-11 NOTE — Telephone Encounter (Signed)
Spoken and notified patient of Kelly Banks comments. Patient is agreeable to the change and please sent to [place]

## 2020-04-11 NOTE — Telephone Encounter (Addendum)
Noted, Rx for Crestor sent to pharmacy. Needs lab appointment for cholesterol check in 2 months, please schedule.

## 2020-04-11 NOTE — Addendum Note (Signed)
Addended by: Pleas Koch on: 04/11/2020 04:59 PM   Modules accepted: Orders

## 2020-04-12 NOTE — Telephone Encounter (Signed)
Spoken to patient and schedule lab appt

## 2020-05-10 ENCOUNTER — Other Ambulatory Visit: Payer: Self-pay | Admitting: Primary Care

## 2020-05-10 DIAGNOSIS — N951 Menopausal and female climacteric states: Secondary | ICD-10-CM

## 2020-05-14 NOTE — Telephone Encounter (Signed)
Called patient last note states that you wanted to have mammo before refills. She has not made app. Per patients request I have sent number in my chart. She will send message with appointment date.

## 2020-05-14 NOTE — Telephone Encounter (Signed)
Will wait until she has appt made

## 2020-05-15 NOTE — Telephone Encounter (Signed)
I will provide her with 30-day supply, she will have to schedule (and eventually complete) mammogram for me to continue providing her with oral hormonal treatment.

## 2020-05-17 NOTE — Telephone Encounter (Signed)
Left message to return call to our office.  

## 2020-06-06 ENCOUNTER — Other Ambulatory Visit: Payer: Self-pay | Admitting: Primary Care

## 2020-06-18 ENCOUNTER — Other Ambulatory Visit (INDEPENDENT_AMBULATORY_CARE_PROVIDER_SITE_OTHER): Payer: 59

## 2020-06-18 ENCOUNTER — Other Ambulatory Visit: Payer: Self-pay

## 2020-06-18 ENCOUNTER — Other Ambulatory Visit: Payer: Self-pay | Admitting: Primary Care

## 2020-06-18 DIAGNOSIS — E785 Hyperlipidemia, unspecified: Secondary | ICD-10-CM

## 2020-06-18 LAB — LIPID PANEL
Cholesterol: 275 mg/dL — ABNORMAL HIGH (ref 0–200)
HDL: 51.8 mg/dL (ref >39.00)
LDL Cholesterol: 196 mg/dL — ABNORMAL HIGH (ref 0–99)
NonHDL: 223.6
Total CHOL/HDL Ratio: 5
Triglycerides: 139 mg/dL (ref 0.0–149.0)
VLDL: 27.8 mg/dL (ref 0.0–40.0)

## 2020-06-25 ENCOUNTER — Other Ambulatory Visit: Payer: Self-pay | Admitting: Primary Care

## 2020-06-25 DIAGNOSIS — N951 Menopausal and female climacteric states: Secondary | ICD-10-CM

## 2020-06-26 NOTE — Telephone Encounter (Signed)
I will no longer be refilling her estradiol, she will need to get this from her GYN or wean off.

## 2020-06-26 NOTE — Telephone Encounter (Signed)
Please see message and advise.  Thank you. Last OV 03/05/20 Last fill 05/15/20  #30/0

## 2020-08-14 ENCOUNTER — Other Ambulatory Visit: Payer: Self-pay

## 2020-08-14 ENCOUNTER — Encounter: Payer: Self-pay | Admitting: Family Medicine

## 2020-08-14 ENCOUNTER — Ambulatory Visit (INDEPENDENT_AMBULATORY_CARE_PROVIDER_SITE_OTHER): Payer: 59 | Admitting: Family Medicine

## 2020-08-14 VITALS — BP 112/73 | HR 52 | Temp 97.8°F | Ht 63.0 in | Wt 169.0 lb

## 2020-08-14 DIAGNOSIS — K219 Gastro-esophageal reflux disease without esophagitis: Secondary | ICD-10-CM

## 2020-08-14 DIAGNOSIS — N951 Menopausal and female climacteric states: Secondary | ICD-10-CM | POA: Diagnosis not present

## 2020-08-14 DIAGNOSIS — Z79890 Hormone replacement therapy (postmenopausal): Secondary | ICD-10-CM

## 2020-08-14 DIAGNOSIS — Z1159 Encounter for screening for other viral diseases: Secondary | ICD-10-CM

## 2020-08-14 DIAGNOSIS — Z720 Tobacco use: Secondary | ICD-10-CM

## 2020-08-14 DIAGNOSIS — Z1211 Encounter for screening for malignant neoplasm of colon: Secondary | ICD-10-CM

## 2020-08-14 DIAGNOSIS — E559 Vitamin D deficiency, unspecified: Secondary | ICD-10-CM | POA: Diagnosis not present

## 2020-08-14 DIAGNOSIS — E785 Hyperlipidemia, unspecified: Secondary | ICD-10-CM | POA: Diagnosis not present

## 2020-08-14 DIAGNOSIS — Z114 Encounter for screening for human immunodeficiency virus [HIV]: Secondary | ICD-10-CM

## 2020-08-14 MED ORDER — SUCRALFATE 1 G PO TABS: 1.0000 g | ORAL_TABLET | Freq: Three times a day (TID) | ORAL | 1 refills | Status: AC

## 2020-08-14 MED ORDER — ESTRADIOL 2 MG PO TABS: 2.0000 mg | ORAL_TABLET | Freq: Every day | ORAL | 3 refills | Status: DC

## 2020-08-14 NOTE — Assessment & Plan Note (Signed)
Recheck vitamin D level 

## 2020-08-14 NOTE — Assessment & Plan Note (Signed)
As above, could not tolerate dose reduction of estradiol Discussed risk for CAD/stroke and breast cancer with long-term estrogen use and she verbalizes understanding and prefers to continue use, but we will continue to reassess, especially in the setting of her elevated cholesterol as below

## 2020-08-14 NOTE — Progress Notes (Signed)
New patient visit   Patient: Kelly Banks   DOB: [birth year]   49 y.o. Female  MRN: [id number] Visit Date: 08/14/2020  Today's healthcare provider: Lavon Paganini, MD   Chief Complaint  Patient presents with  . Establish Care  . Abdominal Pain   Subjective    Kelly Banks is a 49 y.o. female who presents today as a new patient to establish care.  Abdominal Pain This is a chronic problem. The current episode started more than 1 year ago. The problem has been waxing and waning. The pain is located in the RUQ. The abdominal pain does not radiate. Pertinent negatives include no constipation, diarrhea, nausea or vomiting. She has tried proton pump inhibitors (Pt reports omeprazole helps. ) for the symptoms. The treatment provided significant relief.   She is s/p cholecystectomy.  Thinks abd pain started after taking atorvastatin.  She stopped this and tried Crestor and this made it worse.   Omeprazole controls it.  Worse after eating - has not noticed any certain foods that trigger it.  Diclofenac or meloxicam prn for hip pain - does not pain worse.   Vitamin D deficiency - Taking supplement daily with good compliance  Smokes 0-5 cig per day, not every day. Not ready to quit completely.  Hot flashes - has been unable to get off of estrogen after hysterectomy. Make it hard to sleep. +Night sweats.  No h/o VTE, migraines.  Past Medical History:  Diagnosis Date  . Hot flashes   . Hyperlipidemia   . Oral ulcer 01/18/2019  . RUQ abdominal pain 07/20/2019  . Vitamin D deficiency    Past Surgical History:  Procedure Laterality Date  . ABDOMINAL HYSTERECTOMY  2010  . APPENDECTOMY  1992  . CHOLECYSTECTOMY  2017   Family Status  Relation Name Status  . Mother  Alive  . Father  Deceased  . Sister  Alive  . MGM  Deceased  . Brother  Alive  . Neg Hx  (Not Specified)   Family History  Problem Relation Age of Onset  . Hypertension Mother   . Arthritis Mother   . Diabetes  Mother   . Hypertension Father   . Kidney disease Sister   . Hypertension Sister   . Diabetes Maternal Grandmother   . Healthy Brother   . Breast cancer Neg Hx   . Ovarian cancer Neg Hx   . Colon cancer Neg Hx    Social History   Socioeconomic History  . Marital status: Married    Spouse name: Not on file  . Number of children: 2  . Years of education: Not on file  . Highest education level: Not on file  Occupational History  . Occupation: Investment banker, operational  Tobacco Use  . Smoking status: Current Some Day Smoker    Types: Cigarettes  . Smokeless tobacco: Never Used  Vaping Use  . Vaping Use: Never used  Substance and Sexual Activity  . Alcohol use: No    Comment: rare  . Drug use: Never  . Sexual activity: Yes    Partners: Male    Birth control/protection: Surgical  Other Topics Concern  . Not on file  Social History Narrative   Married.   Moved from [place].   Enjoys painting.   Social Determinants of Health   Financial Resource Strain: Not on file  Food Insecurity: Not on file  Transportation Needs: Not on file  Physical Activity: Not on file  Stress: Not on file  Social  Connections: Not on file   Outpatient Medications Prior to Visit  Medication Sig  . Glucosamine HCl (GLUCOSAMINE PO) Take 2 tablets by mouth daily.  . Multiple Vitamins-Minerals (MULTIVITAMIN WITH MINERALS) tablet Take 1 tablet by mouth daily.  Marland Kitchen omeprazole (PRILOSEC) 20 MG capsule Take 20 mg by mouth daily.  . [DISCONTINUED] estradiol (ESTRACE) 2 MG tablet TAKE 1 TABLET BY MOUTH ONCE A DAY  . [DISCONTINUED] rosuvastatin (CRESTOR) 5 MG tablet Take 1 tablet (5 mg total) by mouth every evening. For cholesterol. (Patient not taking: Reported on 08/14/2020)  . [DISCONTINUED] sucralfate (CARAFATE) 1 g tablet Take 1 tablet (1 g total) by mouth 4 (four) times daily -  with meals and at bedtime. (Patient not taking: Reported on 08/14/2020)   No facility-administered medications prior to visit.    Allergies  Allergen Reactions  . Procaine Palpitations    Immunization History  Administered Date(s) Administered  . Influenza, Seasonal, Injecte, Preservative Fre 07/13/2017, 05/04/2018  . Influenza-Unspecified 06/20/2016, 06/09/2019, 06/05/2020  . MMR 09/18/2016  . PFIZER SARS-COV-2 Vaccination 10/25/2019, 11/15/2019  . Tdap 06/20/2016    Health Maintenance  Topic Date Due  . Hepatitis C Screening  Never done  . COLONOSCOPY  Never done  . COVID-19 Vaccine (3 - Booster for Pfizer series) 05/17/2020  . MAMMOGRAM  02/28/2021  . TETANUS/TDAP  06/20/2026  . INFLUENZA VACCINE  Completed  . HIV Screening  Completed    Patient Care Team: Erasmo Downer, MD as PCP - General (Family Medicine)  Review of Systems  Constitutional: Negative.   HENT: Negative.   Eyes: Negative.   Respiratory: Negative.   Cardiovascular: Negative.   Gastrointestinal: Positive for abdominal pain. Negative for abdominal distention, anal bleeding, blood in stool, constipation, diarrhea, nausea, rectal pain and vomiting.  Endocrine: Negative.   Genitourinary: Negative.   Musculoskeletal: Negative.   Allergic/Immunologic: Negative.   Neurological: Negative.   Hematological: Negative.   Psychiatric/Behavioral: Positive for sleep disturbance. Negative for agitation, behavioral problems, confusion, decreased concentration, dysphoric mood, hallucinations, self-injury and suicidal ideas. The patient is not nervous/anxious and is not hyperactive.       Objective    BP 112/73 (BP Location: Right Arm, Patient Position: Sitting, Cuff Size: Large)   Pulse (!) 52   Temp 97.8 F (36.6 C) (Oral)   Ht 5\' 3"  (1.6 m)   Wt 169 lb (76.7 kg)   BMI 29.94 kg/m  Physical Exam Vitals reviewed.  Constitutional:      General: She is not in acute distress.    Appearance: Normal appearance. She is well-developed. She is not diaphoretic.  HENT:     Head: Normocephalic and atraumatic.     Right Ear: Tympanic  membrane, ear canal and external ear normal.     Left Ear: Tympanic membrane, ear canal and external ear normal.     Nose: Nose normal.  Eyes:     General: No scleral icterus.    Conjunctiva/sclera: Conjunctivae normal.     Pupils: Pupils are equal, round, and reactive to light.  Neck:     Thyroid: No thyromegaly.  Cardiovascular:     Rate and Rhythm: Normal rate and regular rhythm.     Pulses: Normal pulses.     Heart sounds: Normal heart sounds. No murmur heard.   Pulmonary:     Effort: Pulmonary effort is normal. No respiratory distress.     Breath sounds: Normal breath sounds. No wheezing or rales.  Abdominal:     General: There is no distension.  Palpations: Abdomen is soft.     Tenderness: There is abdominal tenderness in the right upper quadrant and epigastric area. There is no guarding or rebound. Negative signs include Murphy's sign and McBurney's sign.     Hernia: No hernia is present.  Musculoskeletal:        General: No deformity.     Cervical back: Neck supple.     Right lower leg: No edema.     Left lower leg: No edema.  Lymphadenopathy:     Cervical: No cervical adenopathy.  Skin:    General: Skin is warm and dry.     Findings: No rash.  Neurological:     Mental Status: She is alert and oriented to person, place, and time. Mental status is at baseline.     Sensory: No sensory deficit.     Motor: No weakness.     Gait: Gait normal.  Psychiatric:        Mood and Affect: Mood normal.        Behavior: Behavior normal.        Thought Content: Thought content normal.      Depression Screen PHQ 2/9 Scores 08/14/2020  PHQ - 2 Score 0  PHQ- 9 Score 2   No results found for any visits on 08/14/20.  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    Chronic and stable Uncontrolled when off of estrogen Will continue estrogen for the time being, but did discuss stroke  risk, esp with high cholesterol Will continue to reassess      Relevant Medications   estradiol (ESTRACE) 2 MG tablet     Digestive   Gastroesophageal reflux disease without esophagitis - Primary    Ongoing epigastric/right upper quadrant pain She is status post cholecystectomy with no signs of liver or gallbladder pathology on exam Given that omeprazole is helpful and it is worsened by certain foods, it does sound consistent with gastritis/GERD No recent travel, but given persistence, will check for H. pylori Check CBC and CMP Avoid NSAIDs when possible Continue omeprazole at current dose as this is helpful currently, but did discuss trying to use the lowest dose possible for the shortest amount of time possible Sucralfate as needed We will refer to GI for colon cancer screening and if abdominal pain persists, they could consider EGD at that time as well      Relevant Medications   omeprazole (PRILOSEC) 20 MG capsule   sucralfate (CARAFATE) 1 g tablet   Other Relevant Orders   Ambulatory referral to Gastroenterology   CBC   Comprehensive metabolic panel   H. pylori antigen, stool     Other   Hormone replacement therapy (HRT)    As above, could not tolerate dose reduction of estradiol Discussed risk for CAD/stroke and breast cancer with long-term estrogen use and she verbalizes understanding and prefers to continue use, but we will continue to reassess, especially in the setting of her elevated cholesterol as below      Relevant Medications   estradiol (ESTRACE) 2 MG tablet   Hyperlipidemia    Reviewed last lipid panel ASCVD risk is 4.7%, which is borderline She did not tolerate atorvastatin or Crestor due to worsening abdominal pain per report Encourage diet and exercise changes Plan to recheck fasting lipid panel at CPE       Vitamin D deficiency  Recheck vitamin D level      Relevant Orders   VITAMIN D 25 Hydroxy (Vit-D Deficiency, Fractures)   Tobacco use     Encourage cessation Patient remains precontemplative We will continue to reassess       Other Visit Diagnoses    Colon cancer screening       Relevant Orders   Ambulatory referral to Gastroenterology   Need for hepatitis C screening test       Relevant Orders   Hepatitis C Antibody   Encounter for screening for HIV       Relevant Orders   HIV antibody (with reflex)       Return in about 3 months (around 11/12/2020) for CPE.     Total time spent on today's visit was greater than 45 minutes, including both face-to-face time and nonface-to-face time personally spent on review of chart (labs and imaging), discussing labs and goals, discussing further work-up, treatment options, referrals to specialist if needed, reviewing outside records of pertinent, answering patient's questions, and coordinating care.    I, Lavon Paganini, MD, have reviewed all documentation for this visit. The documentation on 08/14/20 for the exam, diagnosis, procedures, and orders are all accurate and complete.   Adisa Litt, Dionne Bucy, MD, MPH [facility]

## 2020-08-14 NOTE — Patient Instructions (Signed)
Gastritis, Adult Gastritis is inflammation of the stomach. There are two kinds of gastritis:  Acute gastritis. This kind develops suddenly.  Chronic gastritis. This kind is much more common and lasts for a long time. Gastritis happens when the lining of the stomach becomes weak or gets damaged. Without treatment, gastritis can lead to stomach bleeding and ulcers. What are the causes? This condition may be caused by:  An infection.  Drinking too much alcohol.  Certain medicines. These include steroids, antibiotics, and some over-the-counter medicines, such as aspirin or ibuprofen.  Having too much acid in the stomach.  A disease of the intestines or stomach.  Stress.  An allergic reaction.  Crohn's disease.  Some cancer treatments (radiation). Sometimes the cause of this condition is not known. What are the signs or symptoms? Symptoms of this condition include:  Pain or a burning sensation in the upper abdomen.  Nausea.  Vomiting.  An uncomfortable feeling of fullness after eating.  Weight loss.  Bad breath.  Blood in your vomit or stools. In some cases, there are no symptoms. How is this diagnosed? This condition may be diagnosed with:  Your medical history and a description of your symptoms.  A physical exam.  Tests. These can include: ? Blood tests. ? Stool tests. ? A test in which a thin, flexible instrument with a light and a camera is passed down the esophagus and into the stomach (upper endoscopy). ? A test in which a sample of tissue is taken for testing (biopsy). How is this treated? This condition may be treated with medicines. The medicines that are used vary depending on the cause of the gastritis:  If the condition is caused by a bacterial infection, you may be given antibiotic medicines.  If the condition is caused by too much acid in the stomach, you may be given medicines called H2 blockers, proton pump inhibitors, or antacids. Treatment  may also involve stopping the use of certain medicines, such as aspirin, ibuprofen, or other NSAIDs. Follow these instructions at home: Medicines  Take over-the-counter and prescription medicines only as told by your health care provider.  If you were prescribed an antibiotic medicine, take it as told by your health care provider. Do not stop taking the antibiotic even if you start to feel better. Eating and drinking   Eat small, frequent meals instead of large meals.  Avoid foods and drinks that make your symptoms worse.  Drink enough fluid to keep your urine pale yellow. Alcohol use  Do not drink alcohol if: ? Your health care provider tells you not to drink. ? You are pregnant, may be pregnant, or are planning to become pregnant.  If you drink alcohol: ? Limit your use to:  0-1 drink a day for women.  0-2 drinks a day for men. ? Be aware of how much alcohol is in your drink. In the [place] one drink equals one 12 oz bottle of beer (355 mL), one 5 oz glass of wine (148 mL), or one 1 oz glass of hard liquor (44 mL). General instructions  Talk with your health care provider about ways to manage stress, such as getting regular exercise or practicing deep breathing, meditation, or yoga.  Do not use any products that contain nicotine or tobacco, such as cigarettes and e-cigarettes. If you need help quitting, ask your health care provider.  Keep all follow-up visits as told by your health care provider. This is important. Contact a health care provider if:  Your   symptoms get worse.  Your symptoms return after treatment. Get help right away if:  You vomit blood or material that looks like coffee grounds.  You have black or dark red stools.  You are unable to keep fluids down.  Your abdominal pain gets worse.  You have a fever.  You do not feel better after one week. Summary  Gastritis is inflammation of the lining of the stomach that can occur suddenly (acute) or  develop slowly over time (chronic).  This condition is diagnosed with a medical history, a physical exam, or tests.  This condition may be treated with medicines to treat infection or medicines to reduce the amount of acid in your stomach.  Follow your health care provider's instructions about taking medicines, making changes to your diet, and knowing when to call for help. This information is not intended to replace advice given to you by your health care provider. Make sure you discuss any questions you have with your health care provider. Document Revised: 12/29/2017 Document Reviewed: 12/29/2017 Elsevier Patient Education  2020 Elsevier Inc.  

## 2020-08-14 NOTE — Assessment & Plan Note (Signed)
Encourage cessation Patient remains precontemplative We will continue to reassess

## 2020-08-14 NOTE — Assessment & Plan Note (Signed)
Ongoing epigastric/right upper quadrant pain She is status post cholecystectomy with no signs of liver or gallbladder pathology on exam Given that omeprazole is helpful and it is worsened by certain foods, it does sound consistent with gastritis/GERD No recent travel, but given persistence, will check for H. pylori Check CBC and CMP Avoid NSAIDs when possible Continue omeprazole at current dose as this is helpful currently, but did discuss trying to use the lowest dose possible for the shortest amount of time possible Sucralfate as needed We will refer to GI for colon cancer screening and if abdominal pain persists, they could consider EGD at that time as well

## 2020-08-14 NOTE — Assessment & Plan Note (Signed)
Reviewed last lipid panel ASCVD risk is 4.7%, which is borderline She did not tolerate atorvastatin or Crestor due to worsening abdominal pain per report Encourage diet and exercise changes Plan to recheck fasting lipid panel at CPE

## 2020-08-14 NOTE — Assessment & Plan Note (Signed)
Chronic and stable Uncontrolled when off of estrogen Will continue estrogen for the time being, but did discuss stroke risk, esp with high cholesterol Will continue to reassess

## 2020-08-15 ENCOUNTER — Other Ambulatory Visit: Payer: Self-pay | Admitting: Adult Health

## 2020-08-15 DIAGNOSIS — E559 Vitamin D deficiency, unspecified: Secondary | ICD-10-CM

## 2020-08-15 LAB — COMPREHENSIVE METABOLIC PANEL
ALT: 15 IU/L (ref 0–32)
AST: 18 IU/L (ref 0–40)
Albumin/Globulin Ratio: 1.5 (ref 1.2–2.2)
Albumin: 4.3 g/dL (ref 3.8–4.9)
Alkaline Phosphatase: 74 IU/L (ref 44–121)
BUN/Creatinine Ratio: 12 (ref 9–23)
BUN: 11 mg/dL (ref 6–24)
Bilirubin Total: 0.2 mg/dL (ref 0.0–1.2)
CO2: 23 mmol/L (ref 20–29)
Calcium: 9.8 mg/dL (ref 8.7–10.2)
Chloride: 104 mmol/L (ref 96–106)
Creatinine, Ser: 0.91 mg/dL (ref 0.57–1.00)
GFR calc Af Amer: 84 mL/min/{1.73_m2} (ref >59)
GFR calc non Af Amer: 73 mL/min/{1.73_m2} (ref >59)
Globulin, Total: 2.8 g/dL (ref 1.5–4.5)
Glucose: 86 mg/dL (ref 65–99)
Potassium: 4.3 mmol/L (ref 3.5–5.2)
Sodium: 139 mmol/L (ref 134–144)
Total Protein: 7.1 g/dL (ref 6.0–8.5)

## 2020-08-15 LAB — HEPATITIS C ANTIBODY: Hep C Virus Ab: <0.1 s/co ratio (ref 0.0–0.9)

## 2020-08-15 LAB — CBC
Hematocrit: 38.3 % (ref 34.0–46.6)
Hemoglobin: 12.7 g/dL (ref 11.1–15.9)
MCH: 29.7 pg (ref 26.6–33.0)
MCHC: 33.2 g/dL (ref 31.5–35.7)
MCV: 90 fL (ref 79–97)
Platelets: 277 10*3/uL (ref 150–450)
RBC: 4.27 x10E6/uL (ref 3.77–5.28)
RDW: 12.1 % (ref 11.7–15.4)
WBC: 8.9 10*3/uL (ref 3.4–10.8)

## 2020-08-15 LAB — VITAMIN D 25 HYDROXY (VIT D DEFICIENCY, FRACTURES): Vit D, 25-Hydroxy: 23.9 ng/mL — ABNORMAL LOW (ref 30.0–100.0)

## 2020-08-15 LAB — HIV ANTIBODY (ROUTINE TESTING W REFLEX): HIV Screen 4th Generation wRfx: NONREACTIVE

## 2020-08-15 MED ORDER — VITAMIN D (ERGOCALCIFEROL) 1.25 MG (50000 UNIT) PO CAPS: 50000.0000 [IU] | ORAL_CAPSULE | ORAL | 0 refills | Status: DC

## 2020-08-15 NOTE — Progress Notes (Signed)
CBC, CMP within normal limits. Hepatitis C is negative for antibody.  HIV is non reactive.   Will send prescription Vitamin D is still deficient, take as directed and lab recheck ordered as on script.  Meds ordered this encounter Medications  Vitamin D, Ergocalciferol, (DRISDOL) 1.25 MG (50000 UNIT) CAPS capsule   Sig: Take 1 capsule (50,000 Units total) by mouth every 7 (seven) days. (taking one tablet per week) walk in lab in office 1-2 weeks after completing prescription.   Dispense:  12 capsule   Refill:  0

## 2020-08-15 NOTE — Progress Notes (Signed)
Meds ordered this encounter  Medications  . Vitamin D, Ergocalciferol, (DRISDOL) 1.25 MG (50000 UNIT) CAPS capsule    Sig: Take 1 capsule (50,000 Units total) by mouth every 7 (seven) days. (taking one tablet per week) walk in lab in office 1-2 weeks after completing prescription.    Dispense:  12 capsule    Refill:  0    I have already ordered this as a future order in the computer.  Orders Placed This Encounter  Procedures  . VITAMIN D 25 Hydroxy (Vit-D Deficiency, Fractures)

## 2020-08-16 ENCOUNTER — Telehealth: Payer: Self-pay | Admitting: *Deleted

## 2020-08-16 NOTE — Telephone Encounter (Signed)
Reviewed lab results and physician's note with the patient. No additional questions at this time.

## 2020-08-17 LAB — H. PYLORI ANTIGEN, STOOL: H pylori Ag, Stl: NEGATIVE

## 2020-08-22 NOTE — Progress Notes (Signed)
H pylori stool negative

## 2020-11-23 ENCOUNTER — Other Ambulatory Visit: Payer: Self-pay | Admitting: Adult Health

## 2020-11-23 NOTE — Telephone Encounter (Signed)
Requested medication (s) are due for refill today: na   Requested medication (s) are on the active medication list: yes  Last refill:  08/15/20 #12 0 refills  Future visit scheduled: yes in 1 week  Notes to clinic:  not delegated per protocol. Do you want to refill Rx?     Requested Prescriptions  Pending Prescriptions Disp Refills   Vitamin D, Ergocalciferol, (DRISDOL) 1.25 MG (50000 UNIT) CAPS capsule [Pharmacy Med Name: VITAMIN D (ERGOCALCIFEROL) 1.25 MG] 12 capsule 0    Sig: TAKE 1 CAPSULE BY MOUTH EVERY 7 DAYS. WALK IN LAB IN OFFICE IN 1-2 WEEKS AFTERCOMPLETING PRESCRIPTION      Endocrinology:  Vitamins - Vitamin D Supplementation Failed - 11/23/2020 11:30 AM      Failed - 50,000 IU strengths are not delegated      Failed - Phosphate in normal range and within 360 days    No results found for: PHOS        Failed - Vitamin D in normal range and within 360 days    VITD  Date Value Ref Range Status  01/18/2019 33.94 30.00 - 100.00 ng/mL Final   Vit D, 25-Hydroxy  Date Value Ref Range Status  08/14/2020 23.9 (L) 30.0 - 100.0 ng/mL Final    Comment:    Vitamin D deficiency has been defined by the Institute of Medicine and an Endocrine Society practice guideline as a level of serum 25-OH vitamin D less than 20 ng/mL (1,2). The Endocrine Society went on to further define vitamin D insufficiency as a level between 21 and 29 ng/mL (2). 1. IOM (Institute of Medicine). 2010. Dietary reference    intakes for calcium and D. [place]: The    [employer]. 2. Holick MF, Binkley [place], Bischoff-Ferrari HA, et al.    Evaluation, treatment, and prevention of vitamin D    deficiency: an Endocrine Society clinical practice    guideline. JCEM. 2011 Jul; 96(7):1911-30.           Passed - Ca in normal range and within 360 days    Calcium  Date Value Ref Range Status  08/14/2020 9.8 8.7 - 10.2 mg/dL Final          Passed - Valid encounter within last 12 months     Recent Outpatient Visits           3 months ago Gastroesophageal reflux disease without esophagitis   [facility] Bacigalupo, Dionne Bucy, MD       Future Appointments             In 1 week Bacigalupo, Dionne Bucy, MD [facility], [place]

## 2020-11-30 ENCOUNTER — Encounter: Payer: 59 | Admitting: Family Medicine

## 2020-12-05 ENCOUNTER — Ambulatory Visit (INDEPENDENT_AMBULATORY_CARE_PROVIDER_SITE_OTHER): Payer: 59 | Admitting: Family Medicine

## 2020-12-05 ENCOUNTER — Encounter: Payer: Self-pay | Admitting: Family Medicine

## 2020-12-05 ENCOUNTER — Other Ambulatory Visit: Payer: Self-pay

## 2020-12-05 VITALS — BP 99/53 | HR 60 | Temp 98.6°F | Resp 16 | Ht 63.0 in | Wt 167.0 lb

## 2020-12-05 DIAGNOSIS — Z1231 Encounter for screening mammogram for malignant neoplasm of breast: Secondary | ICD-10-CM

## 2020-12-05 DIAGNOSIS — E785 Hyperlipidemia, unspecified: Secondary | ICD-10-CM | POA: Diagnosis not present

## 2020-12-05 DIAGNOSIS — E559 Vitamin D deficiency, unspecified: Secondary | ICD-10-CM | POA: Diagnosis not present

## 2020-12-05 DIAGNOSIS — Z0000 Encounter for general adult medical examination without abnormal findings: Secondary | ICD-10-CM | POA: Diagnosis not present

## 2020-12-05 DIAGNOSIS — Z1211 Encounter for screening for malignant neoplasm of colon: Secondary | ICD-10-CM

## 2020-12-05 NOTE — Progress Notes (Signed)
Complete physical exam   Patient: Kelly Banks   DOB: May 12, 1971   50 y.o. Female  MRN: [id number] Visit Date: 12/05/2020  Today's healthcare provider: Laurita Quint Roselina Burgueno, FNP   Chief Complaint  Patient presents with  . Annual Exam   Subjective    Kelly Banks is a 50 y.o. female who presents today for a complete physical exam.  She reports consuming a general diet. The patient does not participate in regular exercise at present. She generally feels well. She reports sleeping well. She does not have additional problems to discuss today.   Originally from [place] Has been in [place] for 5 years Does not go to the dentist or eye doctor Did not tolerate statins in the past  Past Medical History:  Diagnosis Date  . Hot flashes   . Hyperlipidemia   . Oral ulcer 01/18/2019  . RUQ abdominal pain 07/20/2019  . Vitamin D deficiency    Past Surgical History:  Procedure Laterality Date  . ABDOMINAL HYSTERECTOMY  2010  . APPENDECTOMY  1992  . CHOLECYSTECTOMY  2017   Social History   Socioeconomic History  . Marital status: Married    Spouse name: Not on file  . Number of children: 2  . Years of education: Not on file  . Highest education level: Not on file  Occupational History  . Occupation: Investment banker, operational  Tobacco Use  . Smoking status: Current Some Day Smoker    Types: Cigarettes  . Smokeless tobacco: Never Used  Vaping Use  . Vaping Use: Never used  Substance and Sexual Activity  . Alcohol use: No    Comment: rare  . Drug use: Never  . Sexual activity: Yes    Partners: Male    Birth control/protection: Surgical  Other Topics Concern  . Not on file  Social History Narrative   Married.   Moved from [place].   Enjoys painting.   Social Determinants of Health   Financial Resource Strain: Not on file  Food Insecurity: Not on file  Transportation Needs: Not on file  Physical Activity: Not on file  Stress: Not on file  Social Connections: Not on file   Intimate Partner Violence: Not on file   Family Status  Relation Name Status  . Mother  Alive  . Father  Deceased  . Sister  Alive  . MGM  Deceased  . Brother  Alive  . Neg Hx  (Not Specified)   Family History  Problem Relation Age of Onset  . Hypertension Mother   . Arthritis Mother   . Diabetes Mother   . Hypertension Father   . Kidney disease Sister   . Hypertension Sister   . Diabetes Maternal Grandmother   . Healthy Brother   . Breast cancer Neg Hx   . Ovarian cancer Neg Hx   . Colon cancer Neg Hx    Allergies  Allergen Reactions  . Procaine Palpitations    Patient Care Team: Virginia Crews, MD as PCP - General (Family Medicine)   Medications: Outpatient Medications Prior to Visit  Medication Sig  . estradiol (ESTRACE) 2 MG tablet Take 1 tablet (2 mg total) by mouth daily.  . Glucosamine HCl (GLUCOSAMINE PO) Take 2 tablets by mouth daily.  . Multiple Vitamins-Minerals (MULTIVITAMIN WITH MINERALS) tablet Take 1 tablet by mouth daily.  . Vitamin D, Ergocalciferol, (DRISDOL) 1.25 MG (50000 UNIT) CAPS capsule Take 1 capsule (50,000 Units total) by mouth every 7 (seven) days. (taking one tablet  per week) walk in lab in office 1-2 weeks after completing prescription.  Marland Kitchen omeprazole (PRILOSEC) 20 MG capsule Take 20 mg by mouth daily. (Patient not taking: Reported on 12/05/2020)  . sucralfate (CARAFATE) 1 g tablet Take 1 tablet (1 g total) by mouth 4 (four) times daily -  with meals and at bedtime. (Patient not taking: Reported on 12/05/2020)   No facility-administered medications prior to visit.    Review of Systems  Gastrointestinal: Positive for diarrhea.  Musculoskeletal: Positive for arthralgias.  All other systems reviewed and are negative.    Objective    BP (!) 99/53   Pulse 60   Temp 98.6 F (37 C)   Resp 16   Ht $R'5\' 3"'wL$  (1.6 m)   Wt 167 lb (75.8 kg)   BMI 29.58 kg/m    Physical Exam Vitals reviewed.  Constitutional:      Appearance: Normal  appearance.  HENT:     Right Ear: Tympanic membrane and ear canal normal. There is no impacted cerumen.     Left Ear: Tympanic membrane and ear canal normal. There is no impacted cerumen.     Nose: Nose normal.     Mouth/Throat:     Mouth: Mucous membranes are moist.     Pharynx: Oropharynx is clear. No posterior oropharyngeal erythema.  Eyes:     Extraocular Movements: Extraocular movements intact.     Pupils: Pupils are equal, round, and reactive to light.  Neck:     Thyroid: No thyroid mass, thyromegaly or thyroid tenderness.  Cardiovascular:     Rate and Rhythm: Normal rate and regular rhythm.     Pulses: Normal pulses.     Heart sounds: Normal heart sounds. No murmur heard. No gallop.   Pulmonary:     Effort: Pulmonary effort is normal.     Breath sounds: Normal breath sounds. No wheezing or rhonchi.  Abdominal:     General: Bowel sounds are normal. There is no distension.     Palpations: Abdomen is soft. There is no mass.     Tenderness: There is no abdominal tenderness. There is no right CVA tenderness, left CVA tenderness, guarding or rebound.  Musculoskeletal:        General: Normal range of motion.     Cervical back: Normal range of motion.  Lymphadenopathy:     Cervical: No cervical adenopathy.  Skin:    General: Skin is warm and dry.     Capillary Refill: Capillary refill takes less than 2 seconds.  Neurological:     General: No focal deficit present.     Mental Status: She is alert and oriented to person, place, and time.     Sensory: No sensory deficit.     Motor: No weakness.     Gait: Gait normal.  Psychiatric:        Mood and Affect: Mood normal.        Behavior: Behavior normal.    Last depression screening scores PHQ 2/9 Scores 08/14/2020  PHQ - 2 Score 0  PHQ- 9 Score 2   Last fall risk screening Fall Risk  08/14/2020  Falls in the past year? 0  Number falls in past yr: 0  Injury with Fall? 0  Risk for fall due to : No Fall Risks  Follow up  Falls evaluation completed   Last Audit-C alcohol use screening Alcohol Use Disorder Test (AUDIT) 08/14/2020  1. How often do you have a drink containing alcohol? 2  2. How  many drinks containing alcohol do you have on a typical day when you are drinking? 0  3. How often do you have six or more drinks on one occasion? 0  AUDIT-C Score 2  Alcohol Brief Interventions/Follow-up AUDIT Score <7 follow-up not indicated   A score of 3 or more in women, and 4 or more in men indicates increased risk for alcohol abuse, EXCEPT if all of the points are from question 1   No results found for any visits on 12/05/20.  Assessment & Plan    Routine Health Maintenance and Physical Exam  Exercise Activities and Dietary recommendations Goals   None     Immunization History  Administered Date(s) Administered  . Influenza, Seasonal, Injecte, Preservative Fre 07/13/2017, 05/04/2018  . Influenza-Unspecified 06/20/2016, 06/09/2019, 06/05/2020  . MMR 09/18/2016  . PFIZER(Purple Top)SARS-COV-2 Vaccination 10/25/2019, 11/15/2019  . Tdap 06/20/2016    Health Maintenance  Topic Date Due  . COLONOSCOPY (Pts 45-19yrs Insurance coverage will need to be confirmed)  Never done  . COVID-19 Vaccine (3 - Booster for Pfizer series) 05/17/2020  . MAMMOGRAM  02/28/2021  . INFLUENZA VACCINE  03/25/2021  . TETANUS/TDAP  06/20/2026  . Hepatitis C Screening  Completed  . HIV Screening  Completed  . HPV VACCINES  Aged Out    Discussed health benefits of physical activity, and encouraged her to engage in regular exercise appropriate for her age and condition.  Problem List Items Addressed This Visit      Other   Hyperlipidemia   Relevant Orders   Lipid Panel   Vitamin D deficiency   Relevant Orders   Vitamin D (25 hydroxy)    Other Visit Diagnoses    Encounter for annual physical examination excluding gynecological examination in a patient older than 17 years    -  Primary   Relevant Orders   TSH    Screening for malignant neoplasm of colon       Relevant Orders   Ambulatory referral to Gastroenterology   Encounter for screening mammogram for malignant neoplasm of breast       Relevant Orders   MM DIGITAL SCREENING BILATERAL     Plan Order placed for Mammogram: due in July Placed referral for colonoscopy: never has done Will follow up with lab results Discussed increasing fiber in diet Encouraged eye and dental appointments  Return in about 6 months (around 06/06/2021).      [place], [place] [phone] (phone) [phone] (fax)  [place]

## 2020-12-05 NOTE — Patient Instructions (Signed)
?????????? ????????? ? ??????? ????????? Fiber Content in Foods ????????? -- ??? ????????, ??????? ?????????? ? ???????????? ?????????, ????? ??? ??????, ?????, ?????????????? ????????, ?????, ?????? ? ???????. ? ?????? ????? ??????? ? ?????????????? ??? ??????? ???? ????? ??????????????? ??? ???????????? ?????????, ?????????? ???????????? ?????????? ??????? ?????????. ??? ???????????? ???????? ????? ????????????? ????? ? ??????? ??????????? ?????????, ? ?? ????? ??? ??? ?????? -- ????? ? ?????? ??????????? ?????????. ? ???? ????????? ?????????? ?????????? ? ?????????? ????????? ? ????????? ???????????????? ????????? ???????. ??? ??????? ???? ?????? ???, ??????? ????????? ??? ????? ? ??????? ???????. ?????????? ? ?????? ???????? ????? ??? ?????????, ???? ? ??? ???????? ?????-???? ?????????? ??? ???????. ? ????? ????????? ?????????? ????? ?????????? ??????  ??????? ??? ?????? (??????) -- ?  ????? (75?) ?????????? 4? ?????????.  ????? (?????? ?????) -- ? 1 ????? ??????? ???????? (180?) ?????????? 5,5? ?????????.  ????????? (???????) -- 6-8???? (57-76?) ???????? 5? ?????????.  ?????? ? ??????? -- ? 1????? ??????? ???????? (182?) ?????????? 4,8? ?????????.  ????? -- ? 1 ????? (128?) ?????????? 8,9? ?????????. ?????  ????? (????????????) -- ?  ????? (80?) ?????????? 4,4? ?????????.  ????????? ? ??????? (??????????) -- ? 1 ??????????? ??????? ???????? (173?) ?????????? 4,4? ?????????.  ????? (????????????????) -- ?  ????? (122?) ?????????? 5? ?????????.  ???????????? ??????? (??????????????) - ?  ????? (78?) ?????????? 4? ?????????.  ??????? ?????????-- ?  ????? ???? (124?) ?????????? 4? ?????????.  ????? ????????????? -- ? 1????? ?????????????? ????? (205?) ?????????? 5,7? ?????????. ????????  ???????? ?????? -- ?  ????? (31?) ?????????? 8,6? ?????????.  ?????? (??????????????) -- ?  ????? (70?) ?????????? 4? ?????????.  ????? (??????????????) -- ?  1????? (185?) ?????????? 5,2? ?????????.  ???????-- ? 3?????? (375?) ???????? ?????????? 5,8? ?????????.  ???????? ?? ?????????????? ???????-- ? 1????? (140?) ?????????? 6? ?????????. ???? ? ?????? ?????  ?????? ????? (??????????????) -- ?  ????? (90?) ?????????? 7,7? ?????????.  ???????? (??????????????) -- ?  ????? (90?) ?????????? 7,8? ?????????.  ?????? ???????????? (????????????????) -- ?  ????? (92,5?) ?????????? 5,7? ?????????.  ?????? ???? (????????????????, ???????????? ??? ??????) -- ?  ????? (92,5?) ?????????? 5,2? ?????????.  ?????????? ????, ??????? ??? ?????????????? (????????????????) -- ?  ????? (130?) ?????????? 5,2? ?????????.  ???????? ????? ??? ??? ??????? (????????????????) -- ?  ????? (90?) ?????????? 6,6? ?????????.  ?????? ?????? (??????????????) -- ?  ????? (86?) ?????????? 7,5? ?????????.  ????? ?????? ??? ???????????? ?????? (??????????????) -- ? ????? (91?) ???????? 9,3? ?????????. ??????????? ???? ???????? ????? ???? ???????? ??????? ????????? ? ??????? ??????????? ?????????. ??????????? ?????????? ????????? ????? ?????????? ? ??????????? ?? ??????? ?????????????. ??? ????????? ?????????????? ?????????? ?????????? ? ?????-?????????.   ? ????? ????????? ?????????? ??????? ?????????? ?????????? ??????  ????? -- ? 1 ????? ??????? ???????? (126 ?) ?????????? 3,2? ?????????.  ???? -- ? 1 ????? (155?) ?????????? 1,4? ?????????.  ???????? -- ? 1 ????? ????????? ???????? (154?) ?????????? 3,7? ?????????.  ???? -- ?  ????? (40?) ?????????? 1,8? ?????????.  ???????? ????, ???????????? -- ?  ????? (125?) ?????????? 1,5? ?????????.  ??????? (??????) -- ?  ????? (75?) ?????????? 1,8? ?????????.  ???????? (??????, ?????????? ?????????) -- ? 1 ????? (150?) ?????????? 3? ?????????.  ????? -- ? 1 ????? (140?) ?????????? 2,9? ?????????. ?????  ???????? (??????????????) -- ?  ????? (77,5?) ?????????? 2,1?  ?????????.  ??????? (??????????????) -- ?  ????? (77,5?) ?????????? 2,2? ?????????.  ???????? (???????????????? ??? ????????????) -- ?  ????? (82,5?) ?????????? 2,1? ?????????.  ???????????? ???? -- ?  ????? (105?) ?????????? 1,6? ?????????.  ??????? -- ? 1 ????? ??????? ???????? (62?) ??????????  1,5? ?????????.  ??????? ?????? (????????????????) -- ?  ????? (83?) ?????????? 2? ?????????.  ????? ????????????? -- ?  ????? (58?) ?????????? 1? ?????????.  ?????????? ??????? ????????? -- ? 1 ??????????? ??????? ???????? (150?) ?????????? 3? ?????????.  ??????? ??????? (??????????????) -- ? 1/2????? (90?) ?????????? 2,3? ?????????. ????????  ?????????? ??? ???????????? (??????????????) -- ? 1 ????? (196?) ?????????? 3,5? ?????????.  ?????? ??????? -- ? ????? ??????? ???????? 4????? (10??) ?????????? 2? ?????????.  ??????? ???? ???????? ????????????? -- ?  ????? (120?) ?????????? ????? 2? ?????????.  ???????? ??????????? (??????????????) -- ? 1 ????? (140?) ?????????? 2,5? ?????????.  ????????????? ???? -- ?  ????? (15?) ?????????? ????? 2-4? ?????????.  ?????????????? ???? -- ? 1 ??????? (26?) ?????????? 2? ?????????.  ?????????????? ???????? -- ?  ????? (70?) ?????????? 3,2? ?????????.  ?????????? ??????? -- ? ????? ??????? ???????? 6?????? (15??) ?????????? 1,5? ?????????. ???? ? ?????? ?????  ??????? -- ?  ????? ??? 1????? (28?) ?????????? 3,5? ?????????.  ?????? ????????????? ? ???????? -- ?  ????? ??? ????? (11,5?) ?????????? 1,1? ?????????.  ??????? ??? ?????? ??????? -- ? 1 ??????? (70?) ?????????? 3,4? ?????????.  ??????? ????? -- ?  ????? ??? 1 ????? (30?) ?????????? 2? ?????????.  ?????? ???? -- ? 1???????? ????? (7?) ?????????? 2,8? ?????????. ??????????? ???? ???????? ????? ???? ???????? ??????? ????????? ?? ??????? ??????????? ?????????. ??????????? ?????????? ????????? ????? ?????????? ? ??????????? ??  ??????? ?????????????. ??? ????????? ?????????????? ?????????? ?????????? ? ?????-?????????.   ? ????? ????????? ?????????? ???? ?????????? ? ????????? ? ?????? ??????????? ????????? ???????????? ????? 1? ????????? ?? ??????. ? ??? ?????????: ??????  ????????? ??? -- ? ?????, ??? 4?????? ?????? (118??), ?????????? 0,5? ?????????. ?????  ?????-- ? 1 ????? (35?) ?????????? 0,5? ?????????.  ?????? (???????)-- ? ????? (60?) ?????????? 0,3? ?????????.  ?????????-- ? 1?????? (40?) ???????? 0,1? ?????????. ????????  ????????? ???????-- ? ????? ??????? ???????? 6?????? (15??) ?????????? 0,5? ?????????.  ????? ??? (??????????????) -- ? ????? (81,5?) ?????????? 0,3? ?????????. ???? ? ?????? ?????  ????-- ? 1??????? (50?) ???? ?????????? 0? ?????????.  ????, ????? ??? ???? -- ? 3?????? (85?) ?????????? 0? ?????????. ???????? ????????  ?????? -- ? 1 ?????, ??? 8?????? ?????? (237??) ?????????? 0? ?????????.  ??????-- ? 1 ????? (245?) ?????????? 0? ?????????. ??????????? ???? ???????? ????? ???? ???????? ??????? ????????? ? ?????? ??????????? ?????????. ??????????? ?????????? ????????? ????? ?????????? ? ??????????? ?? ??????? ?????????????. ??? ????????? ?????????????? ?????????? ?????????? ? ?????-?????????.   ??????  ????????? -- ??? ????????, ??????? ?????????? ? ???????????? ?????????, ????? ??? ??????, ?????, ?????????????? ????????, ?????, ?????? ? ???????.  ? ?????? ????? ??????? ? ?????????????? ??? ??????? ???? ????? ??????????????? ??? ???????????? ?????????, ?????????? ???????????? ?????????? ??????? ?????????. ??? ?????????? ?? ????? ???????? ??????, ??????????????? ????? ??????. ??????????? ???????? ????? ???????????? ??? ??????? ? ????? ??????? ??????. Document Revised: 02/16/2020 Document Reviewed: 02/16/2020 Elsevier Patient Education  [place]. ???? ??????? ? ??????? ??????????? ????????? High-Fiber Eating Plan ?????????,  ????? ?????????? ???????? ?????????, ???????????? ????? ???? ?? ????? ?????????. ??? ?????? ? ?????? ???????, ??????, ?????????????? ????????? ? ???????. ????? ? ??????? ??????????? ????????? ????? ????????? ????? ?????? ??? ????????. ??? ??????? ???? ????? ??????????????? ????? ????? ? ????????? ?????:  ???????????? ??????. ????????? ????????? ??????? ????????? ????? ???????????.  ??????? ??????? ???????????.  ?????????? ????????? ?????????: ? ?????????? ??? ? ?????? ??????? (????????). ? ?????????? ???????????? ???????? ???????????????? ?????? (????????????? ????????????). ? ????????? ?? ??????? ??????? ?????, ??????? ????? ???????? ??????? ??????????, ??? ??????? ?????? ????????? ???? ? ?????? (??????? ????????????? ?????????, ???).  ????? ????????????? ??????????, ??? ????? ????? ???????? ???? ????.  ??? ?????????????? ???????? ??????????? ??????, ????????? ???????  2-?? ???? ? ????????? ????? ????. ?????? ?????? ?? ?????????? ????? ?????? ?????? ???????? ?? ?????????  ????? ????????, ??????? ? ???????? ?????????? ?????????, ???????????? ? ??????? ? ??????? ????????, ???????????? ?? ????????. ????????? ????????, ? ??????? ?????????? ??????? 5? ????????? ?? ???? ??????.  ????????????? ???????? ??????????? ?????????: ? ??????? (?? ?????? 50???): 34-38?. ? ??????? (?????? 50???): 28-34?. ? ??????? (?? ?????? 50???): 25-28?. ? ??????? (?????? 50???): 22-25?. ??????? ???????? ?????????? ???????????? ????????? ?????????? _________________ ?.   ???????  ??????? ????? ? ?????? ??????? ? ?????? ? ??????? ????, ? ?? ? ???????????? ??????, ????? ??? ???????? ??? ??? ????.  ????????? ????????????? ???????? ? ??????? ??????????? ?????????, ????? ??? ???????, ????????, ???? ? ??????.  ??? ?????? ????????? ??????? ??????????? ?? ???????? ?????????? ? ??????? ????????. ???????, ??? ? ???????? ????? ????????????? ????????? ?????????. ? ????? ????????? ????? ?????????? ??????????? ??????????  ?????? ? ???? ?? ???? ??????. ????????????? ????  ??? ????????? ? ????????????? ???? ??????????? ?????????????? ????.  ?????? ?????? ???? ???????? ????? ? ?????????? ?????. ???????????? ??????? ????  ????????? ???? ? ???????? ??????? ? ??????? ??????????? ?????????, ???????? ? ????, ??????? ???????? 5? ??? ?????? ????????? ?? ??????.  ????? ???? ? ???????? ????????, ?????????????? ?? ?????????????? ????, ? ?? ?? ???? ??????? ?????? ??? ????? ????.  ?????? ?????? ???? ????? ?????????? ???, ?????? ??? ?????.  ??????????? ???? ?????? ???? ? ?????, ??????? ? ?????? ?? ???????.  ??????? ?? ???, ????? ???????? ???? ????????? ?????? ???? ???????? ????????? ???? ?? ???????? ?????. ????? ??????????  ??????????????? ???????? ???? ????????? ?? ?????? ????????: ? ?????????? ? ???? ????????????? ??????, ?????, ???????? ???????? ? ??????. ? ???????? ? ???? ?????????, ???? ? ??? ??? ??????????? ???????? ??????????, ???????? ??????????. ????? ???????? ????????? ?? ??????? ??????????, ??? ? ???? ??????? ???????.  ???????????? ?????????? ???????????? ????????? ??????????. ???? ?? ?????? ??????? ?????? ??????????? ?????????? ???????????? ?????????, ??? ????? ???????? ? ??????? ??????, ??????? ??? ???????????????.  ????? ????? ????, ????? ????? ???????????? ?????????.  ???????????? ??????? ? ??????? ??????????? ?????????, ????? ??? ?????, ????? ?????, ????? ? ???????. ????? ??????? ??????????? ????????? ?????? ?????. ?????. ??????. ?????????. ???????. ????????? ? ????. ??????? ?????. ????? ??????? ?????????. ??????. ????????? ???????. ????????. ??????? ????????????. ????????. ??????? ???????. ??????? ???????. ???????. ?????. ???????? ?????????????? ????. ????????????? ????. ??????? ?????? ? ???????. ?????????? ???. ??????. ??????. ?????. ?????. ????????????? ??????? ? ????????. ???????. ?????? ????????? ???????. ???? ? ?????? ????? ???????? ????, ???????????? ?????? ? ?????? ?????. ?????? ????.  ??????? ?????. ????????. ????? ? ??????. ???????? ???????? ?????? ? ??????????? ?????????. ??????? ??????????? ?????????? ?????? ??????. ??????????? ?????????? ???????????? ???. ?????? ???????? ???????????? ???????. ????????????? ???? ????? ???? ???????? ??????? ??????????????? ????????? ??????? ? ????????. ??? ????????? ?????????????? ?????????? ?????????? ? ?????-?????????. ????? ????????? ??????? ????????? ?????? ????????? ???. ???????, ????????? ??????. ????? ??????? ????????. ???????????????? ?????. ?????? ?????????? ???????????? ?????. ???????? ????? ????. ?????????? ??????? ?? ???? ??????? ??????. ????? ???. ???? ? ?????? ????? ?????? ????? ????. ??????? ?????? ??? ??????? ????. ???????? ???????? ??????. ??????. ????????? ???. ???????. ???? ? ????? ????????? ?????. ??????? ?????????????? ???????. ?????? ???????? ????? ? ????????. ????????????? ???? ?? ???????? ?????? ??????? ?? ????????????? ????????? ??????? ??? ????????. ?????????? ? ?????????? ? ???, ????? ????? ????????? ??????? ????? ?????? ??? ???. ??????  ????????? ???????????? ????? ??? ?????????. ??? ?????? ? ?????? ????? ?????????, ??? ??????, ?????, ?????????????? ???????? ? ???????.  ????? ? ??????? ??????????? ????????? ????? ????? ???????????. ?? ????? ?????? ????????????? ?????, ??????? ??????? ??????????? ? ?????, ?????????????? ???????? ????? ???? ? ??????? ???? ???????? ? ??? ??????????? ??????, ????????? ??????? ? ????????? ????? ????.  ???????????? ??????????? ????????? ??????????. ??????? ??????? ?????????? ??????????? ????????? ????? ???????? ? ???????, ??????? ?????? ??? ???????????????. ? ?????? ?????????? ?????????? ???????????? ????????? ????? ????? ????.  ? ????????? ?????????? ????????? ????????? ??????? ?????? ? ?????, ?????????????? ????????, ?????, ?????? ? ???????. ??? ?????????? ?? ????? ???????? ??????, ??????????????? ????? ??????. ??????????? ???????? ????? ???????????? ??? ??????? ? ?????  ??????? ??????.   Document Revised: 02/13/2020 Document Reviewed: 02/13/2020 Elsevier Patient Education  [place].

## 2020-12-12 LAB — TSH: TSH: 2.41 u[IU]/mL (ref 0.450–4.500)

## 2020-12-12 LAB — VITAMIN D 25 HYDROXY (VIT D DEFICIENCY, FRACTURES): Vit D, 25-Hydroxy: 32.8 ng/mL (ref 30.0–100.0)

## 2020-12-12 LAB — LIPID PANEL
Chol/HDL Ratio: 5.5 ratio — ABNORMAL HIGH (ref 0.0–4.4)
Cholesterol, Total: 259 mg/dL — ABNORMAL HIGH (ref 100–199)
HDL: 47 mg/dL (ref >39)
LDL Chol Calc (NIH): 174 mg/dL — ABNORMAL HIGH (ref 0–99)
Triglycerides: 202 mg/dL — ABNORMAL HIGH (ref 0–149)
VLDL Cholesterol Cal: 38 mg/dL (ref 5–40)

## 2020-12-19 ENCOUNTER — Other Ambulatory Visit: Payer: Self-pay | Admitting: Family Medicine

## 2020-12-19 DIAGNOSIS — N951 Menopausal and female climacteric states: Secondary | ICD-10-CM

## 2020-12-25 ENCOUNTER — Encounter: Payer: Self-pay | Admitting: *Deleted

## 2021-03-20 ENCOUNTER — Other Ambulatory Visit: Payer: Self-pay | Admitting: Family Medicine

## 2021-03-20 DIAGNOSIS — N951 Menopausal and female climacteric states: Secondary | ICD-10-CM

## 2021-03-20 NOTE — Telephone Encounter (Signed)
Requested Prescriptions  Pending Prescriptions Disp Refills  . estradiol (ESTRACE) 2 MG tablet [Pharmacy Med Name: ESTRADIOL 2 MG TAB] 30 tablet 3    Sig: TAKE 1 TABLET BY MOUTH ONCE A DAY     OB/GYN:  Estrogens Failed - 03/20/2021  3:52 PM      Failed - Mammogram is up-to-date per Health Maintenance      Passed - Last BP in normal range    BP Readings from Last 1 Encounters:  12/05/20 (!) 99/53         Passed - Valid encounter within last 12 months    Recent Outpatient Visits          3 months ago Encounter for annual physical examination excluding gynecological examination in a patient older than 17 years   [facility], Laurita Quint, FNP   7 months ago Gastroesophageal reflux disease without esophagitis   [facility], Dionne Bucy, MD

## 2021-05-15 ENCOUNTER — Ambulatory Visit: Payer: 59

## 2021-06-05 LAB — HM MAMMOGRAPHY

## 2021-08-14 ENCOUNTER — Other Ambulatory Visit: Payer: Self-pay | Admitting: Family Medicine

## 2021-08-14 DIAGNOSIS — N951 Menopausal and female climacteric states: Secondary | ICD-10-CM

## 2021-08-14 NOTE — Telephone Encounter (Signed)
Requested Prescriptions  Pending Prescriptions Disp Refills   estradiol (ESTRACE) 2 MG tablet [Pharmacy Med Name: ESTRADIOL 2 MG TAB] 30 tablet 3    Sig: TAKE 1 TABLET BY MOUTH ONCE A DAY     OB/GYN:  Estrogens Passed - 08/14/2021 12:54 PM      Passed - Mammogram is up-to-date per Health Maintenance      Passed - Last BP in normal range    BP Readings from Last 1 Encounters:  12/05/20 (!) 99/53         Passed - Valid encounter within last 12 months    Recent Outpatient Visits          8 months ago Encounter for annual physical examination excluding gynecological examination in a patient older than 17 years   [facility], Laurita Quint, FNP   1 year ago Gastroesophageal reflux disease without esophagitis   [facility], Dionne Bucy, MD

## 2021-11-13 ENCOUNTER — Other Ambulatory Visit: Payer: Self-pay | Admitting: Family Medicine

## 2021-11-13 DIAGNOSIS — N951 Menopausal and female climacteric states: Secondary | ICD-10-CM

## 2022-02-27 ENCOUNTER — Other Ambulatory Visit: Payer: Self-pay | Admitting: Family Medicine

## 2022-02-27 DIAGNOSIS — N951 Menopausal and female climacteric states: Secondary | ICD-10-CM

## 2022-02-28 NOTE — Telephone Encounter (Signed)
Requested medications are due for refill today.  yes  Requested medications are on the active medications list.  yes  Last refill. 11/13/2021 #90 0 refills  Future visit scheduled.   no  Notes to clinic.  Pt is more than 3 months overdue for OV.    Requested Prescriptions  Pending Prescriptions Disp Refills   estradiol (ESTRACE) 2 MG tablet [Pharmacy Med Name: ESTRADIOL 2 MG TAB] 90 tablet 0    Sig: TAKE 1 TABLET BY MOUTH ONCE A DAY     OB/GYN:  Estrogens Failed - 02/27/2022  3:09 PM      Failed - Valid encounter within last 12 months    Recent Outpatient Visits           1 year ago Encounter for annual physical examination excluding gynecological examination in a patient older than 64 years   [facility], Laurita Quint, FNP   1 year ago Gastroesophageal reflux disease without esophagitis   [facility], Dionne Bucy, MD              [place] is up-to-date per Health Maintenance      Passed - Last BP in normal range    BP Readings from Last 1 Encounters:  12/05/20 (!) 99/53

## 2022-02-28 NOTE — Telephone Encounter (Signed)
Called pt - unable to LM  - mailbox not set up.

## 2022-04-07 IMAGING — US US EXTREM LOW*L* LIMITED
2 of 3 series · 14 of 19 positions shown · non-contrast
Comparison: None.

CLINICAL DATA: Palpable mass in left hip region.

EXAM:
ULTRASOUND left LOWER EXTREMITY LIMITED
TECHNIQUE: Ultrasound examination of the lower extremity soft tissues was
performed in the area of clinical concern.

[Series 1: us extrem low*left* limited · 0.07mm/px · 17 acquisitions, 13 frames shown (1 of 2)]
[im 1/17]
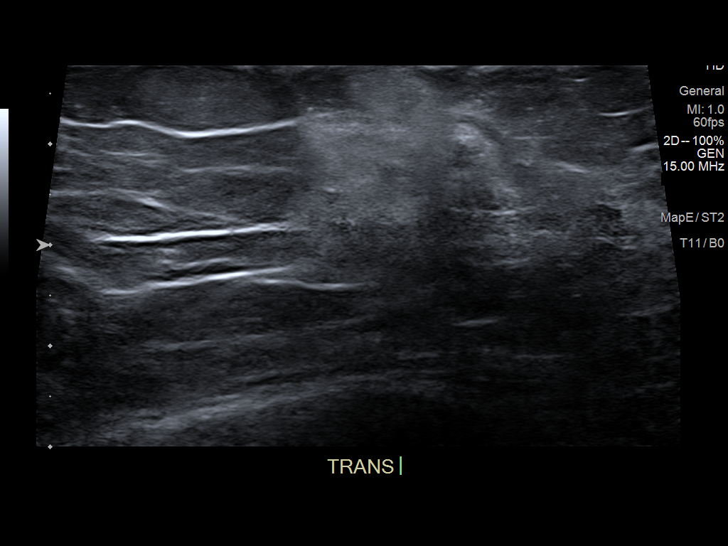
[im 3/17]
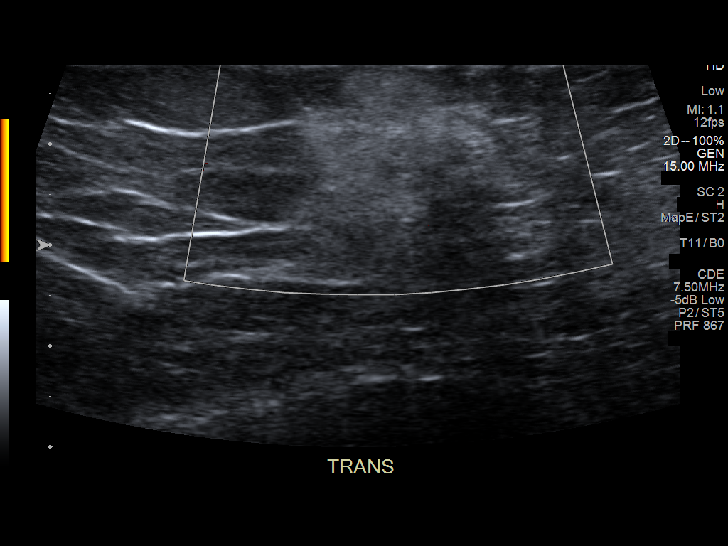
[im 4/17]
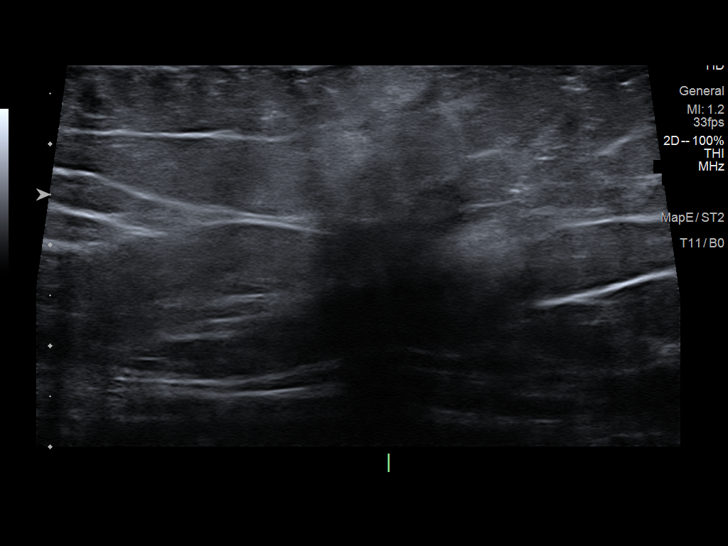
[im 5/17]
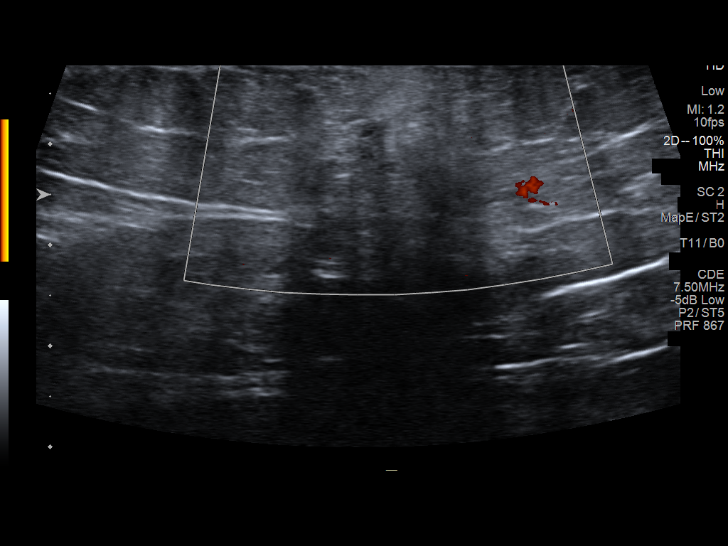
[im 7/17]
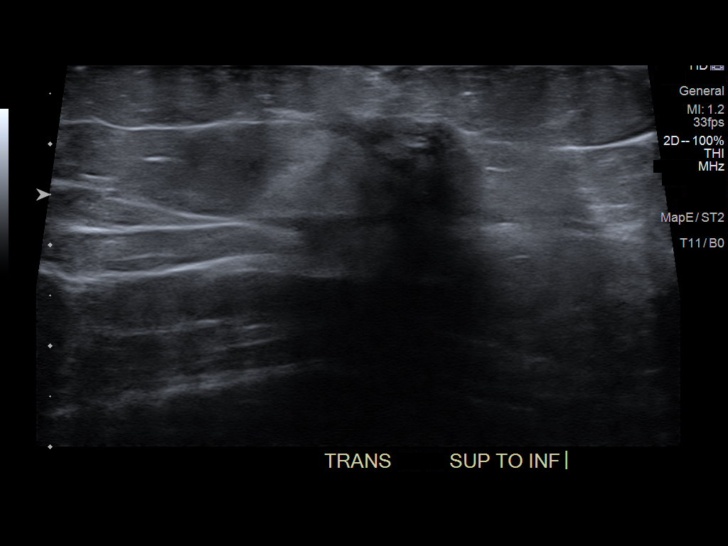
[im 8/17]
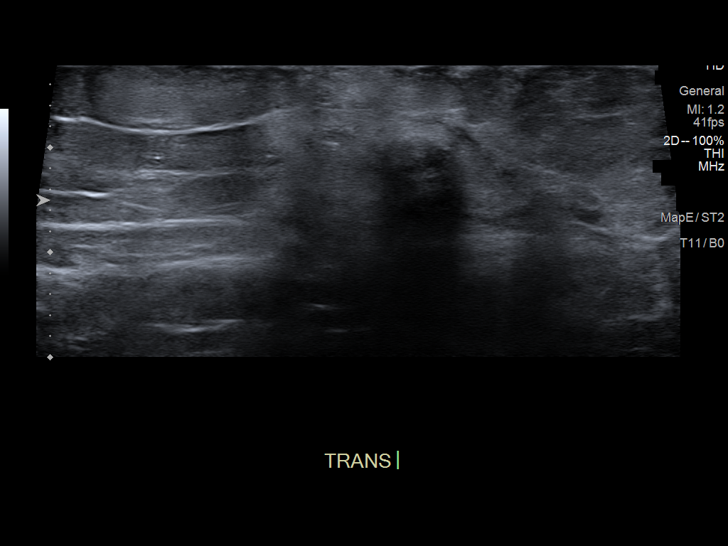
[im 9/17]
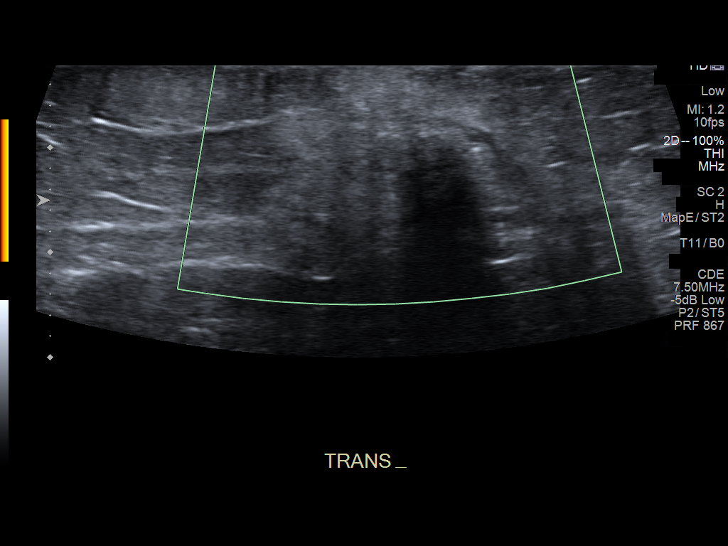
[im 11/17]
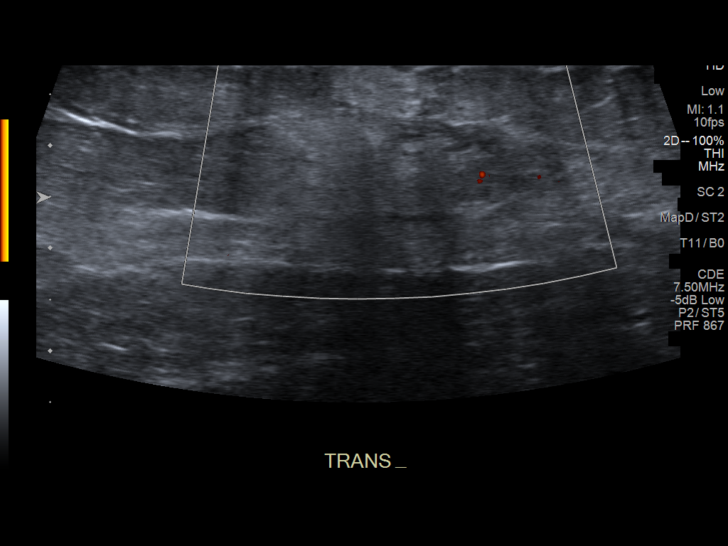
[im 12/17]
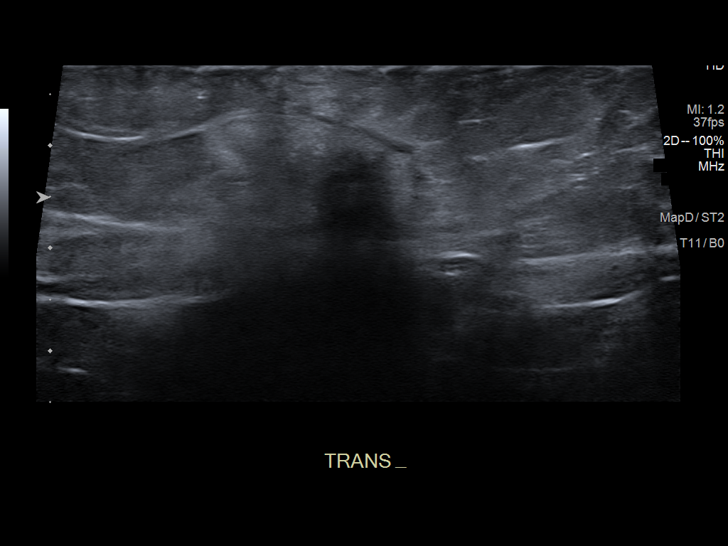
[im 13/17]
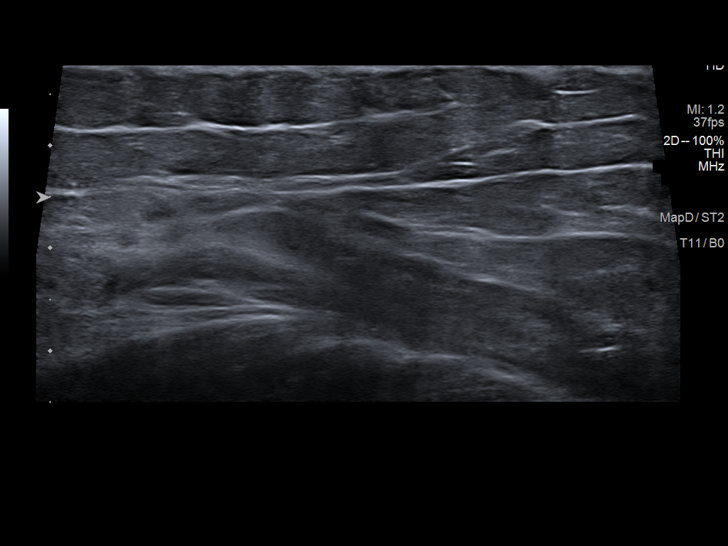
[im 15/17]
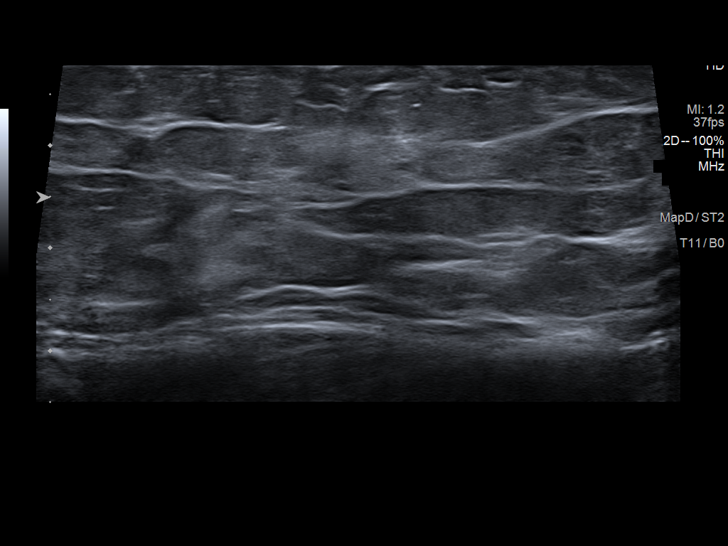
[im 16/17]
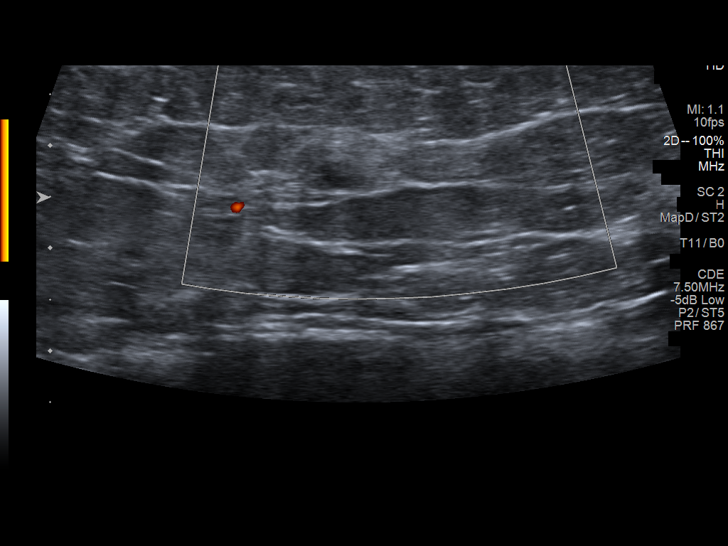
[im 17/17]
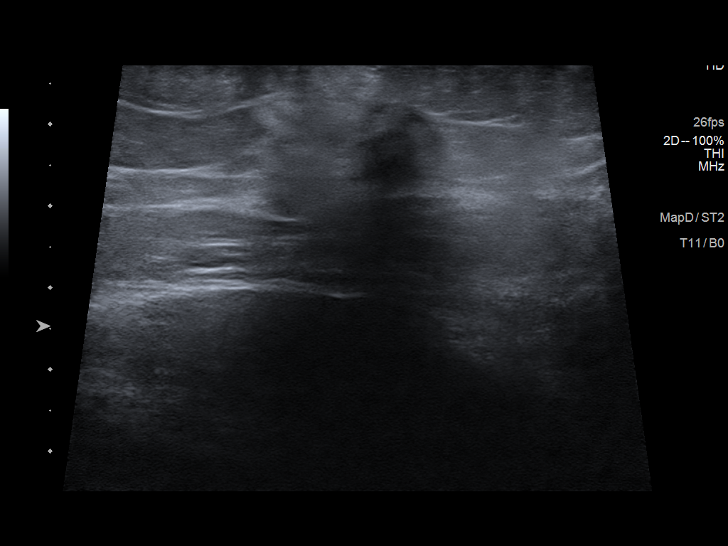

[Series 3: us extrem low*left* limited · 0.07mm/px · 1 of 1 slices shown (2 of 2)]
[im 1/1]
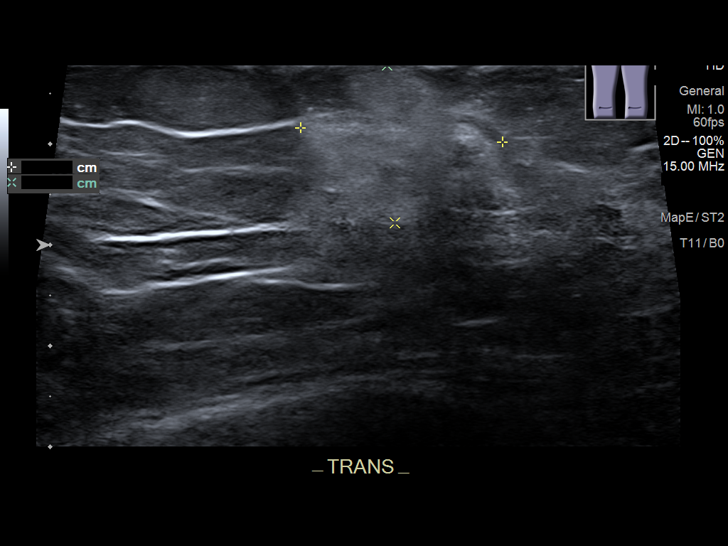

[14 of 19 positions shown; findings below may reference images not displayed]

FINDINGS: Limited sonographic evaluation was performed in the area of palpable
concern in the left hip region. These images demonstrate indistinct
hyperechoic area measuring 2.8 x 1.6 x 1.6 cm which does demonstrate
internal color flow on Doppler. No cyst is noted.
IMPRESSION: 2.8 x 1.6 x 1.6 cm hyperechoic abnormality is seen in the area of
palpable concern in the left hip region. This demonstrates internal
color flow on Doppler, and further evaluation with MRI with and
without gadolinium administration is recommended to evaluate for
possible mass.

## 2022-06-11 NOTE — Progress Notes (Unsigned)
   I,Jonanthan Bolender S Bebe Moncure,acting as a Education administrator for Lavon Paganini, MD.,have documented all relevant documentation on the behalf of Lavon Paganini, MD,as directed by  Lavon Paganini, MD while in the presence of Lavon Paganini, MD.     Established patient visit   Patient: Kelly Banks   DOB: [birth year]   51 y.o. Female  MRN: [id number] Visit Date: 06/12/2022  Today's healthcare provider: Lavon Paganini, MD   No chief complaint on file.  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 Changes made at last visit include no changes.  She reports {excellent/good/fair/poor:19665} compliance with treatment. She feels that condition is {improved/worse/unchanged:3041574}. She {is/is not:21021397} having side effects. ***  -----------------------------------------------------------------------------------------   Medications: Outpatient Medications Prior to Visit  Medication Sig   estradiol (ESTRACE) 2 MG tablet Need an appt for further prescriptions.   Glucosamine HCl (GLUCOSAMINE PO) Take 2 tablets by mouth daily.   Multiple Vitamins-Minerals (MULTIVITAMIN WITH MINERALS) tablet Take 1 tablet by mouth daily.   omeprazole (PRILOSEC) 20 MG capsule Take 20 mg by mouth daily. (Patient not taking: Reported on 12/05/2020)   sucralfate (CARAFATE) 1 g tablet Take 1 tablet (1 g total) by mouth 4 (four) times daily -  with meals and at bedtime. (Patient not taking: Reported on 12/05/2020)   Vitamin D, Ergocalciferol, (DRISDOL) 1.25 MG (50000 UNIT) CAPS capsule Take 1 capsule (50,000 Units total) by mouth every 7 (seven) days. (taking one tablet per week) walk in lab in office 1-2 weeks after completing prescription.   No facility-administered medications prior to visit.    Review of Systems  {Labs  Heme  Chem  Endocrine  Serology  Results Review (optional):23779}   Objective    There were no vitals taken for this  visit. BP Readings from Last 3 Encounters:  12/05/20 (!) 99/53  08/14/20 112/73  03/05/20 120/74   Wt Readings from Last 3 Encounters:  12/05/20 167 lb (75.8 kg)  08/14/20 169 lb (76.7 kg)  03/05/20 171 lb (77.6 kg)      Physical Exam  ***  No results found for any visits on 06/12/22.  Assessment & Plan     ***  No follow-ups on file.      {provider attestation***:1}   Lavon Paganini, MD  [facility] [phone] (phone) [phone] (fax)  [place]

## 2022-06-12 ENCOUNTER — Ambulatory Visit: Payer: 59 | Admitting: Family Medicine

## 2022-06-12 ENCOUNTER — Encounter: Payer: Self-pay | Admitting: Family Medicine

## 2022-06-12 VITALS — BP 91/60 | HR 61 | Temp 97.9°F | Resp 16 | Ht 63.0 in | Wt 164.2 lb

## 2022-06-12 DIAGNOSIS — Z79890 Hormone replacement therapy (postmenopausal): Secondary | ICD-10-CM

## 2022-06-12 DIAGNOSIS — E785 Hyperlipidemia, unspecified: Secondary | ICD-10-CM

## 2022-06-12 DIAGNOSIS — E559 Vitamin D deficiency, unspecified: Secondary | ICD-10-CM

## 2022-06-12 DIAGNOSIS — K219 Gastro-esophageal reflux disease without esophagitis: Secondary | ICD-10-CM

## 2022-06-12 DIAGNOSIS — Z720 Tobacco use: Secondary | ICD-10-CM

## 2022-06-12 DIAGNOSIS — N951 Menopausal and female climacteric states: Secondary | ICD-10-CM

## 2022-06-12 MED ORDER — ESTRADIOL 2 MG PO TABS: 2.0000 mg | ORAL_TABLET | Freq: Every day | ORAL | 1 refills | Status: DC

## 2022-06-12 NOTE — Assessment & Plan Note (Signed)
Discussion regarding the harms of tobacco use, the benefits of cessation, and methods of cessation Patient is currently precontemplative  Will reassess at next visit

## 2022-06-12 NOTE — Assessment & Plan Note (Signed)
Chronic and well controlled  Continue PPI 

## 2022-06-12 NOTE — Assessment & Plan Note (Signed)
Reviewed last lipid panel Not currently on a statin - previously had abd pain with atorvastatin and rosuvastatin Recheck FLP and CMP Discussed diet and exercise

## 2022-06-12 NOTE — Assessment & Plan Note (Signed)
Continue supplement Recheck level 

## 2022-06-12 NOTE — Assessment & Plan Note (Signed)
Well controlled on estradiol Has return of symptoms without medication May consider lower dose in the future if able to tolerate

## 2022-06-12 NOTE — Assessment & Plan Note (Signed)
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

## 2022-06-13 LAB — COMPREHENSIVE METABOLIC PANEL
ALT: 18 IU/L (ref 0–32)
AST: 23 IU/L (ref 0–40)
Albumin/Globulin Ratio: 1.5 (ref 1.2–2.2)
Albumin: 4.4 g/dL (ref 3.8–4.9)
Alkaline Phosphatase: 70 IU/L (ref 44–121)
BUN/Creatinine Ratio: 11 (ref 9–23)
BUN: 11 mg/dL (ref 6–24)
Bilirubin Total: 0.5 mg/dL (ref 0.0–1.2)
CO2: 22 mmol/L (ref 20–29)
Calcium: 10.2 mg/dL (ref 8.7–10.2)
Chloride: 101 mmol/L (ref 96–106)
Creatinine, Ser: 0.99 mg/dL (ref 0.57–1.00)
Globulin, Total: 2.9 g/dL (ref 1.5–4.5)
Glucose: 87 mg/dL (ref 70–99)
Potassium: 4.4 mmol/L (ref 3.5–5.2)
Sodium: 136 mmol/L (ref 134–144)
Total Protein: 7.3 g/dL (ref 6.0–8.5)
eGFR: 68 mL/min/{1.73_m2} (ref >59)

## 2022-06-13 LAB — VITAMIN D 25 HYDROXY (VIT D DEFICIENCY, FRACTURES): Vit D, 25-Hydroxy: 31.5 ng/mL (ref 30.0–100.0)

## 2022-06-13 LAB — LIPID PANEL
Chol/HDL Ratio: 3.8 ratio (ref 0.0–4.4)
Cholesterol, Total: 207 mg/dL — ABNORMAL HIGH (ref 100–199)
HDL: 54 mg/dL (ref >39)
LDL Chol Calc (NIH): 131 mg/dL — ABNORMAL HIGH (ref 0–99)
Triglycerides: 122 mg/dL (ref 0–149)
VLDL Cholesterol Cal: 22 mg/dL (ref 5–40)

## 2022-06-23 ENCOUNTER — Ambulatory Visit: Payer: 59 | Admitting: Family Medicine

## 2022-09-29 ENCOUNTER — Telehealth: Payer: Self-pay

## 2022-09-29 DIAGNOSIS — Z711 Person with feared health complaint in whom no diagnosis is made: Secondary | ICD-10-CM

## 2022-09-29 NOTE — Progress Notes (Unsigned)
   I,Glenn Christo S [place],acting as a Education administrator for Lavon Paganini, MD.,have documented all relevant documentation on the behalf of Lavon Paganini, MD,as directed by  Lavon Paganini, MD while in the presence of Lavon Paganini, MD.     Established patient visit   Patient: Kelly Banks   DOB: [birth year]   52 y.o. Female  MRN: [id number] Visit Date: 09/30/2022  Today's healthcare provider: Lavon Paganini, MD   No chief complaint on file.  Subjective    HPI  ***  Medications: Outpatient Medications Prior to Visit  Medication Sig   cholecalciferol (VITAMIN D3) 25 MCG (1000 UNIT) tablet Take 1,000 Units by mouth daily.   estradiol (ESTRACE) 2 MG tablet Take 1 tablet (2 mg total) by mouth daily. Need an appt for further prescriptions.   Glucosamine HCl (GLUCOSAMINE PO) Take 2 tablets by mouth daily.   Multiple Vitamins-Minerals (MULTIVITAMIN WITH MINERALS) tablet Take 1 tablet by mouth daily.   omeprazole (PRILOSEC) 20 MG capsule Take 20 mg by mouth daily.   sucralfate (CARAFATE) 1 g tablet Take 1 tablet (1 g total) by mouth 4 (four) times daily -  with meals and at bedtime.   No facility-administered medications prior to visit.    Review of Systems  {Labs  Heme  Chem  Endocrine  Serology  Results Review (optional):23779}   Objective    There were no vitals taken for this visit. {Show previous vital signs (optional):23777}  Physical Exam  ***  No results found for any visits on 09/30/22.  Assessment & Plan     ***  No follow-ups on file.      {provider attestation***:1}   Lavon Paganini, MD  [facility] [phone] (phone) [phone] (fax)  [place]

## 2022-09-29 NOTE — Telephone Encounter (Signed)
Copied from [place] [phone]. Topic: Referral - Request for Referral >> Sep 29, 2022  9:47 AM Sabas Sous wrote: Has patient seen PCP for this complaint? Yes.   *If NO, is insurance requiring patient see PCP for this issue before PCP can refer them? 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

## 2022-09-30 ENCOUNTER — Ambulatory Visit: Payer: 59 | Admitting: Family Medicine

## 2022-09-30 ENCOUNTER — Encounter: Payer: Self-pay | Admitting: Family Medicine

## 2022-09-30 VITALS — BP 90/60 | HR 62 | Temp 97.9°F | Resp 16 | Wt 169.0 lb

## 2022-09-30 DIAGNOSIS — D225 Melanocytic nevi of trunk: Secondary | ICD-10-CM

## 2022-10-30 DIAGNOSIS — D229 Melanocytic nevi, unspecified: Secondary | ICD-10-CM | POA: Diagnosis not present

## 2022-10-30 DIAGNOSIS — L298 Other pruritus: Secondary | ICD-10-CM | POA: Diagnosis not present

## 2022-10-30 DIAGNOSIS — L821 Other seborrheic keratosis: Secondary | ICD-10-CM | POA: Diagnosis not present

## 2022-12-25 ENCOUNTER — Other Ambulatory Visit: Payer: Self-pay | Admitting: Family Medicine

## 2022-12-25 DIAGNOSIS — N951 Menopausal and female climacteric states: Secondary | ICD-10-CM

## 2022-12-25 NOTE — Telephone Encounter (Signed)
Requested Prescriptions  Pending Prescriptions Disp Refills   estradiol (ESTRACE) 2 MG tablet [Pharmacy Med Name: ESTRADIOL 2MG  TABLET] 90 tablet 0    Sig: TAKE ONE TABLET BY MOUTH ONCE A DAY. NEEDED AN APPT FOR REFILLS     OB/GYN:  Estrogens Passed - 12/25/2022  9:22 AM      Passed - Mammogram is up-to-date per Health Maintenance      Passed - Last BP in normal range    BP Readings from Last 1 Encounters:  09/30/22 90/60         Passed - Valid encounter within last 12 months    Recent Outpatient Visits           2 months ago Nevus of abdominal wall   [place] [facility] [place], Marzella Schlein, MD   6 months ago Hot flashes due to menopause   [place] [facility] [place], Marzella Schlein, MD   2 years ago Encounter for annual physical examination excluding gynecological examination in a patient older than 17 years   [place] [facility], Azalee Course, FNP   2 years ago Gastroesophageal reflux disease without esophagitis    [facility] [place], Marzella Schlein, MD       Future Appointments             In 8 months Deirdre Evener, MD [facility]

## 2023-03-31 ENCOUNTER — Encounter: Payer: Self-pay | Admitting: Family Medicine

## 2023-03-31 ENCOUNTER — Ambulatory Visit (INDEPENDENT_AMBULATORY_CARE_PROVIDER_SITE_OTHER): Payer: 59 | Admitting: Family Medicine

## 2023-03-31 VITALS — BP 90/63 | HR 52 | Temp 98.1°F | Resp 12 | Ht 63.0 in | Wt 160.5 lb

## 2023-03-31 DIAGNOSIS — Z1211 Encounter for screening for malignant neoplasm of colon: Secondary | ICD-10-CM | POA: Diagnosis not present

## 2023-03-31 DIAGNOSIS — N951 Menopausal and female climacteric states: Secondary | ICD-10-CM | POA: Diagnosis not present

## 2023-03-31 DIAGNOSIS — Z79890 Hormone replacement therapy (postmenopausal): Secondary | ICD-10-CM | POA: Diagnosis not present

## 2023-03-31 DIAGNOSIS — Z0000 Encounter for general adult medical examination without abnormal findings: Secondary | ICD-10-CM

## 2023-03-31 DIAGNOSIS — Z1231 Encounter for screening mammogram for malignant neoplasm of breast: Secondary | ICD-10-CM

## 2023-03-31 DIAGNOSIS — E785 Hyperlipidemia, unspecified: Secondary | ICD-10-CM | POA: Diagnosis not present

## 2023-03-31 DIAGNOSIS — H9201 Otalgia, right ear: Secondary | ICD-10-CM | POA: Diagnosis not present

## 2023-03-31 DIAGNOSIS — E559 Vitamin D deficiency, unspecified: Secondary | ICD-10-CM | POA: Diagnosis not present

## 2023-03-31 MED ORDER — ESTRADIOL 2 MG PO TABS: 2.0000 mg | ORAL_TABLET | Freq: Every day | ORAL | 3 refills | Status: AC

## 2023-03-31 NOTE — Progress Notes (Signed)
Complete physical exam  Patient: Kelly Banks   DOB: December 04, 1970   52 y.o. Female  MRN: [id number]  Subjective:    Chief Complaint  Patient presents with   Annual Exam    Kelly Banks is a 52 y.o. female who presents today for a complete physical exam. She reports consuming a general diet.  She generally feels fairly well. She reports sleeping fairly well. She does have additional problems to discuss today.   Discussed the use of AI scribe software for clinical note transcription with the patient, who gave verbal consent to proceed.  History of Present Illness   The patient presents for a physical exam and reports a persistent right earache. They self-medicated with Amoxicillin, which they had on hand, but the duration of treatment is unclear. The earache was initially associated with swelling, which has since resolved, but pain persists, particularly when pressure is applied. They also report a headache. The patient believes the earache may have been caused by water entering their ears while using a hot tub. They have since started using ear plugs. The left ear was also initially affected but has since improved.  They are currently taking estrogen daily for an menopausal hot flashes, having tried to discontinue it in the past without success. They have had a hysterectomy and therefore do not need to take progesterone.        Most recent fall risk assessment:    03/31/2023    8:36 AM  Fall Risk   Falls in the past year? 0  Number falls in past yr: 0  Injury with Fall? 0  Risk for fall due to : No Fall Risks  Follow up Falls evaluation completed     Most recent depression screenings:    03/31/2023    8:36 AM 09/30/2022    1:19 PM  PHQ 2/9 Scores  PHQ - 2 Score 1 0  PHQ- 9 Score  0        Patient Care Team: Erasmo Downer, MD as PCP - General (Family Medicine)   Outpatient Medications Prior to Visit  Medication Sig   Multiple Vitamins-Minerals (MULTIVITAMIN WITH  MINERALS) tablet Take 1 tablet by mouth daily.   omeprazole (PRILOSEC) 20 MG capsule Take 20 mg by mouth as needed.   sucralfate (CARAFATE) 1 g tablet Take 1 tablet (1 g total) by mouth 4 (four) times daily -  with meals and at bedtime. (Patient taking differently: Take 1 g by mouth as needed.)   [DISCONTINUED] estradiol (ESTRACE) 2 MG tablet TAKE ONE TABLET BY MOUTH ONCE A DAY. NEEDED AN APPT FOR REFILLS   [DISCONTINUED] cholecalciferol (VITAMIN D3) 25 MCG (1000 UNIT) tablet Take 1,000 Units by mouth daily. (Patient not taking: Reported on 03/31/2023)   [DISCONTINUED] Glucosamine HCl (GLUCOSAMINE PO) Take 2 tablets by mouth daily. (Patient not taking: Reported on 03/31/2023)   No facility-administered medications prior to visit.    ROS per HPI        Objective:     BP 90/63 (BP Location: Left Arm, Patient Position: Sitting, Cuff Size: Large)   Pulse (!) 52   Temp 98.1 F (36.7 C) (Temporal)   Resp 12   Ht 5\' 3"  (1.6 m)   Wt 160 lb 8 oz (72.8 kg)   SpO2 98%   BMI 28.43 kg/m    Physical Exam   No results found for any visits on 03/31/23.     Assessment & Plan:    Routine Health Maintenance and Physical  Exam  Immunization History  Administered Date(s) Administered   Influenza, Seasonal, Injecte, Preservative Fre 07/13/2017, 05/04/2018   Influenza,inj,Quad PF,6+ Mos 04/25/2022   Influenza-Unspecified 06/20/2016, 06/09/2019, 06/05/2020   MMR 09/18/2016   PFIZER(Purple Top)SARS-COV-2 Vaccination 10/25/2019, 11/15/2019   Tdap 06/20/2016    Health Maintenance  Topic Date Due   Zoster Vaccines- Shingrix (1 of 2) Never done   Colonoscopy  Never done   COVID-19 Vaccine (3 - Pfizer risk series) 12/13/2019   INFLUENZA VACCINE  11/23/2023 (Originally 03/26/2023)   MAMMOGRAM  06/06/2023   DTaP/Tdap/Td (2 - Td or Tdap) 06/20/2026   Hepatitis C Screening  Completed   HIV Screening  Completed   HPV VACCINES  Aged Out    Discussed health benefits of physical activity, and  encouraged her to engage in regular exercise appropriate for her age and condition.  Problem List Items Addressed This Visit       Cardiovascular and Mediastinum   Hot flashes due to menopause   Relevant Medications   estradiol (ESTRACE) 2 MG tablet     Other   Hormone replacement therapy (HRT)   Relevant Medications   estradiol (ESTRACE) 2 MG tablet   Hyperlipidemia   Relevant Orders   Comprehensive metabolic panel   Lipid Panel With LDL/HDL Ratio   Vitamin D deficiency   Relevant Orders   VITAMIN D 25 Hydroxy (Vit-D Deficiency, Fractures)   Other Visit Diagnoses     Encounter for annual physical exam    -  Primary   Screen for colon cancer       Relevant Orders   Ambulatory referral to Gastroenterology   Breast cancer screening by mammogram       Relevant Orders   MM 3D SCREENING MAMMOGRAM BILATERAL BREAST          Ear pain Right ear pain and swelling after hot tub exposure, improved with self-administered Amoxicillin. Left ear pain resolved. On examination, no current signs of infection, but residual lymph node swelling. -Continue Amoxicillin to complete a 10-day course.  Menopausal Symptoms Continued need for estrogen replacement therapy due to persistent symptoms when attempting to discontinue. -Refill estrogen prescription.  Colon Cancer Screening Discussed need for colonoscopy. -Send referral to GI for colonoscopy scheduling.  Breast Cancer Screening Last mammogram in 2022, due for current year. -Schedule mammogram at [facility].  Vaccinations Discussed upcoming availability of COVID booster and flu shot. -Plan to receive COVID booster and flu shot at pharmacy in the fall.  General Health Maintenance -Order labs for kidney and liver function, cholesterol, and vitamin D. -Schedule physical for next year.       Return in about 1 year (around 03/30/2024) for CPE.     Shirlee Latch, MD

## 2023-04-08 ENCOUNTER — Encounter: Payer: Self-pay | Admitting: *Deleted

## 2023-04-09 ENCOUNTER — Other Ambulatory Visit: Payer: Self-pay | Admitting: Family Medicine

## 2023-04-09 DIAGNOSIS — H9201 Otalgia, right ear: Secondary | ICD-10-CM

## 2023-04-09 NOTE — Progress Notes (Signed)
Continues to have R er pain. Patient requests ENT referral.

## 2023-04-14 ENCOUNTER — Telehealth: Payer: Self-pay

## 2023-04-14 ENCOUNTER — Telehealth: Payer: Self-pay | Admitting: *Deleted

## 2023-04-14 ENCOUNTER — Other Ambulatory Visit: Payer: Self-pay | Admitting: *Deleted

## 2023-04-14 DIAGNOSIS — Z1211 Encounter for screening for malignant neoplasm of colon: Secondary | ICD-10-CM

## 2023-04-14 MED ORDER — NA SULFATE-K SULFATE-MG SULF 17.5-3.13-1.6 GM/177ML PO SOLN: 1.0000 | Freq: Once | ORAL | 0 refills | Status: AC

## 2023-04-14 NOTE — Telephone Encounter (Addendum)
Spoken to patient's husband on DPR  Colonoscopy schedule for 07/20/2023 with Dr Allegra Lai at [facility]

## 2023-04-14 NOTE — Telephone Encounter (Signed)
Gastroenterology Pre-Procedure Review  Request Date: 07/20/2023 Requesting Physician: Dr. Allegra Lai  PATIENT REVIEW QUESTIONS: The patient responded to the following health history questions as indicated:    1. Are you having any GI issues? no 2. Do you have a personal history of Polyps? no 3. Do you have a family history of Colon Cancer or Polyps? no 4. Diabetes Mellitus? no 5. Joint replacements in the past 12 months?no 6. Major health problems in the past 3 months?no 7. Any artificial heart valves, MVP, or defibrillator?no    MEDICATIONS & ALLERGIES:    Patient reports the following regarding taking any anticoagulation/antiplatelet therapy:   Plavix, Coumadin, Eliquis, Xarelto, Lovenox, Pradaxa, Brilinta, or Effient? no Aspirin? no  Patient confirms/reports the following medications:  Current Outpatient Medications  Medication Sig Dispense Refill   estradiol (ESTRACE) 2 MG tablet Take 1 tablet (2 mg total) by mouth daily. 90 tablet 3   Multiple Vitamins-Minerals (MULTIVITAMIN WITH MINERALS) tablet Take 1 tablet by mouth daily.     omeprazole (PRILOSEC) 20 MG capsule Take 20 mg by mouth as needed.     sucralfate (CARAFATE) 1 g tablet Take 1 tablet (1 g total) by mouth 4 (four) times daily -  with meals and at bedtime. (Patient taking differently: Take 1 g by mouth as needed.) 90 tablet 1   No current facility-administered medications for this visit.    Patient confirms/reports the following allergies:  Allergies  Allergen Reactions   Procaine Palpitations    No orders of the defined types were placed in this encounter.   AUTHORIZATION INFORMATION Primary Insurance: 1D#: Group #:  Secondary Insurance: 1D#: Group #:  SCHEDULE INFORMATION: Date: 07/20/2023 Time: Location:  [facility]

## 2023-04-14 NOTE — Telephone Encounter (Signed)
Patient husband Dannielle Huh) called in to schedule her colonoscopy.

## 2023-07-20 ENCOUNTER — Ambulatory Visit: Payer: 59 | Admitting: General Practice

## 2023-07-20 ENCOUNTER — Encounter
Admission: RE | Disposition: A | Discharge status: Home / Self Care | Payer: Self-pay | Source: Ambulatory Visit | Attending: Gastroenterology

## 2023-07-20 ENCOUNTER — Ambulatory Visit
Admission: RE | Admit: 2023-07-20 | Discharge: 2023-07-20 | Disposition: A | Discharge status: Home / Self Care | Payer: 59 | Source: Ambulatory Visit | Attending: Gastroenterology | Admitting: Gastroenterology

## 2023-07-20 ENCOUNTER — Encounter: Payer: Self-pay | Admitting: Gastroenterology

## 2023-07-20 DIAGNOSIS — E785 Hyperlipidemia, unspecified: Secondary | ICD-10-CM | POA: Diagnosis not present

## 2023-07-20 DIAGNOSIS — K219 Gastro-esophageal reflux disease without esophagitis: Secondary | ICD-10-CM | POA: Diagnosis not present

## 2023-07-20 DIAGNOSIS — D128 Benign neoplasm of rectum: Secondary | ICD-10-CM | POA: Insufficient documentation

## 2023-07-20 DIAGNOSIS — Z1211 Encounter for screening for malignant neoplasm of colon: Secondary | ICD-10-CM | POA: Insufficient documentation

## 2023-07-20 DIAGNOSIS — K621 Rectal polyp: Secondary | ICD-10-CM

## 2023-07-20 DIAGNOSIS — F17200 Nicotine dependence, unspecified, uncomplicated: Secondary | ICD-10-CM | POA: Diagnosis not present

## 2023-07-20 HISTORY — PX: POLYPECTOMY: SHX5525

## 2023-07-20 HISTORY — PX: COLONOSCOPY WITH PROPOFOL: SHX5780

## 2023-07-20 SURGERY — COLONOSCOPY WITH PROPOFOL
Anesthesia: General

## 2023-07-20 MED ORDER — SODIUM CHLORIDE 0.9 % IV SOLN
INTRAVENOUS | Status: DC
  Administered 2023-07-20: 20 mL/h via INTRAVENOUS

## 2023-07-20 MED ORDER — LIDOCAINE HCL (PF) 2 % IJ SOLN
INTRAMUSCULAR | Status: DC | PRN
  Administered 2023-07-20: 50 mg via INTRADERMAL

## 2023-07-20 MED ORDER — PROPOFOL 500 MG/50ML IV EMUL
INTRAVENOUS | Status: DC | PRN
  Administered 2023-07-20: 80 ug/kg/min via INTRAVENOUS
  Administered 2023-07-20: 150 ug/kg/min via INTRAVENOUS

## 2023-07-20 MED ORDER — PROPOFOL 1000 MG/100ML IV EMUL
INTRAVENOUS | Status: AC
Start: 2023-07-20 — End: ?
  Filled 2023-07-20: qty 100

## 2023-07-20 NOTE — H&P (Signed)
Arlyss Repress, MD [address]  [address]  [place], [place] [address]  Main: [phone]  Fax: [phone] Pager: [phone]  Primary Care Physician:  Erasmo Downer, MD Primary Gastroenterologist:  Dr. Arlyss Repress  Pre-Procedure History & Physical: HPI:  Kelly Banks is a 52 y.o. female is here for an colonoscopy.   Past Medical History:  Diagnosis Date   Hot flashes    Hyperlipidemia    Oral ulcer 01/18/2019   RUQ abdominal pain 07/20/2019   Vitamin D deficiency     Past Surgical History:  Procedure Laterality Date   ABDOMINAL HYSTERECTOMY  2010   APPENDECTOMY  1992   CHOLECYSTECTOMY  2017    Prior to Admission medications   Medication Sig Start Date End Date Taking? Authorizing Provider  estradiol (ESTRACE) 2 MG tablet Take 1 tablet (2 mg total) by mouth daily. 03/31/23  Yes Bacigalupo, Marzella Schlein, MD  Multiple Vitamins-Minerals (MULTIVITAMIN WITH MINERALS) tablet Take 1 tablet by mouth daily.   Yes [provider]  omeprazole (PRILOSEC) 20 MG capsule Take 20 mg by mouth as needed.   Yes [provider]  sucralfate (CARAFATE) 1 g tablet Take 1 tablet (1 g total) by mouth 4 (four) times daily -  with meals and at bedtime. Patient taking differently: Take 1 g by mouth as needed. 08/14/20  Yes Erasmo Downer, MD    Allergies as of 04/15/2023 - Review Complete 03/31/2023  Allergen Reaction Noted   Procaine Palpitations 11/20/2016    Family History  Problem Relation Age of Onset   Hypertension Mother    Arthritis Mother    Diabetes Mother    Hypertension Father    Kidney disease Sister    Hypertension Sister    Diabetes Maternal Grandmother    Healthy Brother    Breast cancer Neg Hx    Ovarian cancer Neg Hx    Colon cancer Neg Hx     Social History   Socioeconomic History   Marital status: Married    Spouse name: Not on file   Number of children: 2   Years of education: Not on file   Highest education level: Not  on file  Occupational History   Occupation: Theme park manager  Tobacco Use   Smoking status: Some Days    Types: Cigarettes   Smokeless tobacco: Never  Vaping Use   Vaping status: Never Used  Substance and Sexual Activity   Alcohol use: No    Comment: rare   Drug use: Never   Sexual activity: Yes    Partners: Male    Birth control/protection: Surgical  Other Topics Concern   Not on file  Social History Narrative   Married.   Moved from [place].   Enjoys painting.   Social Determinants of Health   Financial Resource Strain: Not on file  Food Insecurity: Not on file  Transportation Needs: Not on file  Physical Activity: Not on file  Stress: Not on file  Social Connections: Not on file  Intimate Partner Violence: Not on file    Review of Systems: See HPI, otherwise negative ROS  Physical Exam: BP 105/63   Pulse (!) 56   Temp (!) 96.5 F (35.8 C) (Temporal)   Resp 20   Ht 5\' 3"  (1.6 m)   Wt 74.5 kg   SpO2 100%   BMI 29.09 kg/m  General:   Alert,  pleasant and cooperative in NAD Head:  Normocephalic and atraumatic. Neck:  Supple; no masses  or thyromegaly. Lungs:  Clear throughout to auscultation.    Heart:  Regular rate and rhythm. Abdomen:  Soft, nontender and nondistended. Normal bowel sounds, without guarding, and without rebound.   Neurologic:  Alert and  oriented x4;  grossly normal neurologically.  Impression/Plan: Kelly Banks is here for an colonoscopy to be performed for colon cancer screening  Risks, benefits, limitations, and alternatives regarding  colonoscopy have been reviewed with the patient.  Questions have been answered.  All parties agreeable.   Lannette Donath, MD  07/20/2023, 7:49 AM

## 2023-07-20 NOTE — Transfer of Care (Signed)
Immediate Anesthesia Transfer of Care Note  Patient: Kelly Banks  Procedure(s) Performed: COLONOSCOPY WITH PROPOFOL POLYPECTOMY  Patient Location: PACU  Anesthesia Type:General  Level of Consciousness: drowsy  Airway & Oxygen Therapy: Patient Spontanous Breathing  Post-op Assessment: Report given to RN and Post -op Vital signs reviewed and stable  Post vital signs: Reviewed and stable  Last Vitals:  Vitals Value Taken Time  BP 90/52 07/20/23 0853  Temp    Pulse 56 07/20/23 0853  Resp 19 07/20/23 0853  SpO2 99 % 07/20/23 0853    Last Pain:  Vitals:   07/20/23 0853  TempSrc:   PainSc: Asleep         Complications: There were no known notable events for this encounter.

## 2023-07-20 NOTE — Anesthesia Postprocedure Evaluation (Signed)
Anesthesia Post Note  Patient: Kelly Banks  Procedure(s) Performed: COLONOSCOPY WITH PROPOFOL POLYPECTOMY  Patient location during evaluation: Endoscopy Anesthesia Type: General Level of consciousness: awake and alert Pain management: pain level controlled Vital Signs Assessment: post-procedure vital signs reviewed and stable Respiratory status: spontaneous breathing, nonlabored ventilation, respiratory function stable and patient connected to nasal cannula oxygen Cardiovascular status: blood pressure returned to baseline and stable Postop Assessment: no apparent nausea or vomiting Anesthetic complications: no  There were no known notable events for this encounter.   Last Vitals:  Vitals:   07/20/23 0903 07/20/23 0913  BP: (!) 92/58 102/70  Pulse: (!) 59   Resp: 15   Temp:    SpO2: 100%     Last Pain:  Vitals:   07/20/23 0903  TempSrc:   PainSc: 0-No pain                 Stephanie Coup

## 2023-07-20 NOTE — Anesthesia Preprocedure Evaluation (Signed)
Anesthesia Evaluation  Patient identified by MRN, date of birth, ID band Patient awake    Reviewed: Allergy & Precautions, NPO status , Patient's Chart, lab work & pertinent test results  Airway Mallampati: III  TM Distance: >3 FB Neck ROM: full    Dental  (+) Chipped   Pulmonary neg pulmonary ROS, Current Smoker and Patient abstained from smoking.   Pulmonary exam normal        Cardiovascular negative cardio ROS Normal cardiovascular exam     Neuro/Psych negative neurological ROS  negative psych ROS   GI/Hepatic Neg liver ROS,GERD  Medicated,,  Endo/Other  negative endocrine ROS    Renal/GU negative Renal ROS  negative genitourinary   Musculoskeletal   Abdominal   Peds  Hematology negative hematology ROS (+)   Anesthesia Other Findings Past Medical History: No date: Hot flashes No date: Hyperlipidemia 01/18/2019: Oral ulcer 07/20/2019: RUQ abdominal pain No date: Vitamin D deficiency  Past Surgical History: 2010: ABDOMINAL HYSTERECTOMY 1992: APPENDECTOMY 2017: CHOLECYSTECTOMY  BMI    Body Mass Index: 29.09 kg/m      Reproductive/Obstetrics negative OB ROS                             Anesthesia Physical Anesthesia Plan  ASA: 2  Anesthesia Plan: General   Post-op Pain Management: Minimal or no pain anticipated   Induction: Intravenous  PONV Risk Score and Plan: 3 and Propofol infusion, TIVA and Ondansetron  Airway Management Planned: Nasal Cannula  Additional Equipment: None  Intra-op Plan:   Post-operative Plan:   Informed Consent: I have reviewed the patients History and Physical, chart, labs and discussed the procedure including the risks, benefits and alternatives for the proposed anesthesia with the patient or authorized representative who has indicated his/her understanding and acceptance.     Dental advisory given  Plan Discussed with: CRNA and  Surgeon  Anesthesia Plan Comments: (Discussed risks of anesthesia with patient, including possibility of difficulty with spontaneous ventilation under anesthesia necessitating airway intervention, PONV, and rare risks such as cardiac or respiratory or neurological events, and allergic reactions. Discussed the role of CRNA in patient's perioperative care. Patient understands.)       Anesthesia Quick Evaluation

## 2023-07-20 NOTE — Op Note (Signed)
[facility] Gastroenterology Patient Name: Kelly Banks Procedure Date: 07/20/2023 8:18 AM MRN: [id number] Account #: [id number] Date of Birth: [birth year] Admit Type: Outpatient Age: 52 Room: [facility] ENDO ROOM 4 Gender: Female Note Status: Finalized Instrument Name: Peds Colonoscope [id number] Procedure:             Colonoscopy Indications:           Screening for colorectal malignant neoplasm, This is                         the patient's first colonoscopy Providers:             Toney Reil MD, MD Medicines:             General Anesthesia Complications:         No immediate complications. Estimated blood loss: None. Procedure:             Pre-Anesthesia Assessment:                        - Prior to the procedure, a History and Physical was                         performed, and patient medications and allergies were                         reviewed. The patient is competent. The risks and                         benefits of the procedure and the sedation options and                         risks were discussed with the patient. All questions                         were answered and informed consent was obtained.                         Patient identification and proposed procedure were                         verified by the physician, the nurse, the                         anesthesiologist, the anesthetist and the technician                         in the pre-procedure area in the procedure room in the                         endoscopy suite. Mental Status Examination: alert and                         oriented. Airway Examination: normal oropharyngeal                         airway and neck mobility. Respiratory Examination:                         clear to  auscultation. CV Examination: normal.                         Prophylactic Antibiotics: The patient does not require                         prophylactic antibiotics. Prior Anticoagulants: The                          patient has taken no anticoagulant or antiplatelet                         agents. ASA Grade Assessment: II - A patient with mild                         systemic disease. After reviewing the risks and                         benefits, the patient was deemed in satisfactory                         condition to undergo the procedure. The anesthesia                         plan was to use general anesthesia. Immediately prior                         to administration of medications, the patient was                         re-assessed for adequacy to receive sedatives. The                         heart rate, respiratory rate, oxygen saturations,                         blood pressure, adequacy of pulmonary ventilation, and                         response to care were monitored throughout the                         procedure. The physical status of the patient was                         re-assessed after the procedure.                        After obtaining informed consent, the colonoscope was                         passed under direct vision. Throughout the procedure,                         the patient's blood pressure, pulse, and oxygen                         saturations were monitored continuously. The  Colonoscope was introduced through the anus and                         advanced to the the cecum, identified by appendiceal                         orifice and ileocecal valve. The colonoscopy was                         performed without difficulty. The patient tolerated                         the procedure well. The quality of the bowel                         preparation was evaluated using the BBPS [facility] Bowel                         Preparation Scale) with scores of: Right Colon = 3,                         Transverse Colon = 3 and Left Colon = 3 (entire mucosa                         seen well with no residual staining, small fragments                          of stool or opaque liquid). The total BBPS score                         equals 9. The ileocecal valve, appendiceal orifice,                         and rectum were photographed. Findings:      The perianal and digital rectal examinations were normal. Pertinent       negatives include normal sphincter tone and no palpable rectal lesions.      A 5 mm polyp was found in the rectum. The polyp was sessile. The polyp       was removed with a cold snare. Resection and retrieval were complete.       Estimated blood loss: none.      The retroflexed view of the distal rectum and anal verge was normal and       showed no anal or rectal abnormalities. Impression:            - One 5 mm polyp in the rectum, removed with a cold                         snare. Resected and retrieved.                        - The distal rectum and anal verge are normal on                         retroflexion view. Recommendation:        - Discharge patient to home (with escort).                        -  Resume previous diet today.                        - Continue present medications.                        - Await pathology results.                        - Repeat colonoscopy in 5-10 years for surveillance                         based on pathology results. 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 All rights reserved. The codes documented in this report are preliminary and upon coder review may  be revised to meet current compliance requirements. Dr. Libby Maw Toney Reil MD, MD 07/20/2023 8:50:33 AM This report has been signed electronically. Number of Addenda: 0 Note Initiated On: 07/20/2023 8:18 AM Scope Withdrawal Time: 0 hours 7 minutes 44 seconds  Total Procedure Duration: 0 hours 17 minutes 2 seconds  Estimated Blood Loss:  Estimated blood loss: none.      [facility]

## 2023-07-21 ENCOUNTER — Encounter: Payer: Self-pay | Admitting: Gastroenterology

## 2023-07-22 LAB — SURGICAL PATHOLOGY

## 2023-07-26 ENCOUNTER — Encounter: Payer: Self-pay | Admitting: Gastroenterology

## 2023-09-02 ENCOUNTER — Telehealth: Payer: Self-pay | Admitting: Family Medicine

## 2023-09-02 NOTE — Telephone Encounter (Signed)
 Spoke with Nillie's husband, Jesusita Oka. They are having to switch insurances and therefore change providers to a [facility] facility. I told him that they would need to come and sign release in order for [place] to transfer records.

## 2023-09-17 ENCOUNTER — Ambulatory Visit: Payer: Self-pay | Admitting: Dermatology

## 2024-01-19 ENCOUNTER — Encounter: Payer: Self-pay | Admitting: Family Medicine

## 2024-03-31 ENCOUNTER — Encounter: Payer: Self-pay | Admitting: Family Medicine
